# Patient Record
Sex: Female | Born: 1970 | Race: Black or African American | Hispanic: No | Marital: Single | Smoking: Never smoker
Health system: Southern US, Community
[De-identification: ages and names within clinical notes are randomized; demographics above are authoritative.]

## PROBLEM LIST (undated history)

## (undated) DIAGNOSIS — I471 Supraventricular tachycardia, unspecified: Secondary | ICD-10-CM

## (undated) DIAGNOSIS — I10 Essential (primary) hypertension: Secondary | ICD-10-CM

## (undated) DIAGNOSIS — I499 Cardiac arrhythmia, unspecified: Secondary | ICD-10-CM

## (undated) DIAGNOSIS — H269 Unspecified cataract: Secondary | ICD-10-CM

## (undated) HISTORY — DX: Supraventricular tachycardia, unspecified: I47.10

## (undated) HISTORY — DX: Unspecified cataract: H26.9

## (undated) HISTORY — DX: Essential (primary) hypertension: I10

## (undated) HISTORY — DX: Supraventricular tachycardia: I47.1

## (undated) HISTORY — DX: Cardiac arrhythmia, unspecified: I49.9

---

## 2005-09-27 ENCOUNTER — Ambulatory Visit: Payer: Self-pay | Admitting: Obstetrics and Gynecology

## 2006-02-18 ENCOUNTER — Ambulatory Visit: Payer: Self-pay | Admitting: Obstetrics and Gynecology

## 2006-02-21 ENCOUNTER — Ambulatory Visit: Payer: Self-pay | Admitting: Obstetrics and Gynecology

## 2006-02-22 ENCOUNTER — Ambulatory Visit: Payer: Self-pay | Admitting: Obstetrics and Gynecology

## 2006-04-24 ENCOUNTER — Ambulatory Visit: Payer: Self-pay | Admitting: Obstetrics and Gynecology

## 2006-05-15 ENCOUNTER — Inpatient Hospital Stay: Payer: Self-pay | Admitting: Obstetrics and Gynecology

## 2008-01-08 ENCOUNTER — Ambulatory Visit: Payer: Self-pay | Admitting: Gastroenterology

## 2008-04-16 ENCOUNTER — Ambulatory Visit: Payer: Self-pay | Admitting: Internal Medicine

## 2009-04-15 ENCOUNTER — Other Ambulatory Visit: Payer: Self-pay | Admitting: Internal Medicine

## 2009-07-20 ENCOUNTER — Other Ambulatory Visit: Payer: Self-pay | Admitting: Internal Medicine

## 2010-04-20 ENCOUNTER — Other Ambulatory Visit: Payer: Self-pay | Admitting: Internal Medicine

## 2011-06-12 ENCOUNTER — Ambulatory Visit: Payer: Self-pay | Admitting: Cardiovascular Disease

## 2011-06-12 DIAGNOSIS — R072 Precordial pain: Secondary | ICD-10-CM

## 2011-06-22 ENCOUNTER — Ambulatory Visit (INDEPENDENT_AMBULATORY_CARE_PROVIDER_SITE_OTHER): Payer: PRIVATE HEALTH INSURANCE | Admitting: Internal Medicine

## 2011-06-22 ENCOUNTER — Encounter: Payer: Self-pay | Admitting: Internal Medicine

## 2011-06-22 DIAGNOSIS — Z9289 Personal history of other medical treatment: Secondary | ICD-10-CM | POA: Insufficient documentation

## 2011-06-22 DIAGNOSIS — E538 Deficiency of other specified B group vitamins: Secondary | ICD-10-CM

## 2011-06-22 DIAGNOSIS — Z124 Encounter for screening for malignant neoplasm of cervix: Secondary | ICD-10-CM | POA: Insufficient documentation

## 2011-06-22 DIAGNOSIS — I498 Other specified cardiac arrhythmias: Secondary | ICD-10-CM

## 2011-06-22 DIAGNOSIS — D509 Iron deficiency anemia, unspecified: Secondary | ICD-10-CM

## 2011-06-22 DIAGNOSIS — I471 Supraventricular tachycardia: Secondary | ICD-10-CM

## 2011-06-22 MED ORDER — CYANOCOBALAMIN 1000 MCG/ML IJ SOLN: 1000.0000 ug | INTRAMUSCULAR | Status: DC

## 2011-06-22 NOTE — Progress Notes (Signed)
  Subjective:    Patient ID: Theresa Fox, female    DOB: February 13, 1971, 40 y.o.   MRN: [id number]  HPI   40 yo AA female with no significant past medical history returns after she underwent a stress test on Oct 23 by Dossie Arbour for baseline tachycardia.  She was noted to have a very high resting heart rate of around 120 with SVT noted during the test.  Since then she has been taking 5 mg bystolic daily with improvement in baseline tachycardia.  Her pulse has been in the 70s lately. Has not returned to exercise yet.  bp has been in the low 110s to 120's .   No past medical history on file. No current outpatient prescriptions on file prior to visit.    Review of Systems  Constitutional: Negative for fever, chills and unexpected weight change.  HENT: Negative for hearing loss, ear pain, nosebleeds, congestion, sore throat, facial swelling, rhinorrhea, sneezing, mouth sores, trouble swallowing, neck pain, neck stiffness, voice change, postnasal drip, sinus pressure, tinnitus and ear discharge.   Eyes: Negative for pain, discharge, redness and visual disturbance.  Respiratory: Negative for cough, chest tightness, shortness of breath, wheezing and stridor.   Cardiovascular: Negative for chest pain, palpitations and leg swelling.  Musculoskeletal: Negative for myalgias and arthralgias.  Skin: Negative for color change and rash.  Neurological: Negative for dizziness, weakness, light-headedness and headaches.  Hematological: Negative for adenopathy.       Objective:   Physical Exam  Constitutional: She is oriented to person, place, and time. She appears well-developed and well-nourished.  HENT:  Mouth/Throat: Oropharynx is clear and moist.  Eyes: EOM are normal. Pupils are equal, round, and reactive to light. No scleral icterus.  Neck: Normal range of motion. Neck supple. No JVD present. No thyromegaly present.  Cardiovascular: Normal rate, regular rhythm, normal heart sounds and intact distal  pulses.   Pulmonary/Chest: Effort normal and breath sounds normal.  Abdominal: Soft. Bowel sounds are normal. She exhibits no mass. There is no tenderness.  Musculoskeletal: Normal range of motion. She exhibits no edema.  Lymphadenopathy:    She has no cervical adenopathy.  Neurological: She is alert and oriented to person, place, and time.  Skin: Skin is warm and dry.  Psychiatric: She has a normal mood and affect.          Assessment & Plan:

## 2011-06-24 DIAGNOSIS — I471 Supraventricular tachycardia: Secondary | ICD-10-CM | POA: Insufficient documentation

## 2011-06-24 NOTE — Assessment & Plan Note (Signed)
Etiology unclear., review of last year's office visits show no evidence  of pulses greater than 80.   Will check thyroid.  Reviewed caffeine intake which is not excessive.

## 2011-08-06 ENCOUNTER — Other Ambulatory Visit (INDEPENDENT_AMBULATORY_CARE_PROVIDER_SITE_OTHER): Payer: PRIVATE HEALTH INSURANCE | Admitting: *Deleted

## 2011-08-06 DIAGNOSIS — D649 Anemia, unspecified: Secondary | ICD-10-CM

## 2011-08-06 LAB — FERRITIN: Ferritin: 15.2 ng/mL (ref 10.0–291.0)

## 2011-08-06 LAB — CBC WITH DIFFERENTIAL/PLATELET
Basophils Relative: 0.4 % (ref 0.0–3.0)
Eosinophils Absolute: 0.1 10*3/uL (ref 0.0–0.7)
HCT: 38.8 % (ref 36.0–46.0)
Hemoglobin: 13.7 g/dL (ref 12.0–15.0)
MCHC: 35.2 g/dL (ref 30.0–36.0)
MCV: 90.2 fl (ref 78.0–100.0)
Monocytes Absolute: 0.4 10*3/uL (ref 0.1–1.0)
Neutro Abs: 4.6 10*3/uL (ref 1.4–7.7)
RBC: 4.3 Mil/uL (ref 3.87–5.11)

## 2011-08-06 LAB — IRON AND TIBC
%SAT: 25 % (ref 20–55)
Iron: 93 ug/dL (ref 42–145)
UIBC: 284 ug/dL (ref 125–400)

## 2011-08-08 ENCOUNTER — Encounter: Payer: Self-pay | Admitting: *Deleted

## 2011-08-16 ENCOUNTER — Encounter: Payer: Self-pay | Admitting: Internal Medicine

## 2011-09-12 ENCOUNTER — Encounter: Payer: Self-pay | Admitting: Internal Medicine

## 2011-09-12 ENCOUNTER — Ambulatory Visit (INDEPENDENT_AMBULATORY_CARE_PROVIDER_SITE_OTHER): Payer: PRIVATE HEALTH INSURANCE | Admitting: Internal Medicine

## 2011-09-12 VITALS — BP 124/80 | HR 104 | Temp 98.2°F | Wt 179.0 lb

## 2011-09-12 DIAGNOSIS — H6690 Otitis media, unspecified, unspecified ear: Secondary | ICD-10-CM

## 2011-09-12 DIAGNOSIS — H6692 Otitis media, unspecified, left ear: Secondary | ICD-10-CM | POA: Insufficient documentation

## 2011-09-12 MED ORDER — NEBIVOLOL HCL 5 MG PO TABS: 5.0000 mg | ORAL_TABLET | Freq: Every day | ORAL | Status: DC

## 2011-09-12 MED ORDER — AMOXICILLIN-POT CLAVULANATE 875-125 MG PO TABS: 1.0000 | ORAL_TABLET | Freq: Two times a day (BID) | ORAL | Status: AC

## 2011-09-12 MED ORDER — FLUCONAZOLE 150 MG PO TABS: 150.0000 mg | ORAL_TABLET | Freq: Every day | ORAL | Status: AC

## 2011-09-12 MED ORDER — MOMETASONE FUROATE 50 MCG/ACT NA SUSP: 2.0000 | Freq: Every day | NASAL | Status: DC

## 2011-09-12 NOTE — Progress Notes (Signed)
Subjective:    Patient ID: Theresa Fox, female    DOB: 08/24/1970, 41 y.o.   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 She has been having frequent headaches eustachian tube dysfunction but denies fevers and flu symptoms. She has been taking over-the-counter medications consistently with no significant improvement. She is nonsmoker and she did have her flu vaccine this year . No past medical history on file. Current Outpatient Prescriptions on File Prior to Visit  Medication Sig Dispense Refill  . CYANOCOBALAMIN IJ Inject as directed every 30 (thirty) days.        . cyanocobalamin (,VITAMIN B-12,) 1000 MCG/ML injection Inject 1 mL (1,000 mcg total) into the muscle once a week.  10 mL  1     Review of Systems  Constitutional: Negative for fever, chills and unexpected weight change.  HENT: Positive for ear pain, congestion, rhinorrhea, postnasal drip and sinus pressure. Negative for hearing loss, nosebleeds, sore throat, facial swelling, sneezing, mouth sores, trouble swallowing, neck pain, neck stiffness, voice change, tinnitus and ear discharge.   Eyes: Negative for pain, discharge, redness and visual disturbance.  Respiratory: Negative for cough, chest tightness, shortness of breath, wheezing and stridor.   Cardiovascular: Negative for chest pain, palpitations and leg swelling.  Musculoskeletal: Negative for myalgias and arthralgias.  Skin: Negative for color change and rash.  Neurological: Positive for headaches. Negative for dizziness, weakness and light-headedness.  Hematological: Negative for adenopathy.       Objective:   Physical Exam  Constitutional: She is oriented to person, place, and time. She appears well-developed and well-nourished.  HENT:  Mouth/Throat: Oropharynx is clear and moist.  Eyes: EOM are normal. Pupils are equal, round, and  reactive to light. No scleral icterus.  Neck: Normal range of motion. Neck supple. No JVD present. No thyromegaly present.  Cardiovascular: Normal rate, regular rhythm, normal heart sounds and intact distal pulses.   Pulmonary/Chest: Effort normal and breath sounds normal.  Abdominal: Soft. Bowel sounds are normal. She exhibits no mass. There is no tenderness.  Musculoskeletal: Normal range of motion. She exhibits no edema.  Lymphadenopathy:    She has no cervical adenopathy.  Neurological: She is alert and oriented to person, place, and time.  Skin: Skin is warm and dry.  Psychiatric: She has a normal mood and affect.          Assessment & Plan:   Otitis media of left ear With erythematous eardrum and serous effusion noted. We'll treat empirically with Augmentin and decongestant. She does have a history of SVT so we will use Sudafed PE cautiously. She can use topical Afrin and saline nasal spray if the Sudafed PE causes tachycardia.    Updated Medication List Outpatient Encounter Prescriptions as of 09/12/2011  Medication Sig Dispense Refill  . CYANOCOBALAMIN IJ Inject as directed every 30 (thirty) days.        . nebivolol (BYSTOLIC) 5 MG tablet Take 5 mg by mouth daily.      Marland Kitchen amoxicillin-clavulanate (AUGMENTIN) 875-125 MG per tablet Take 1 tablet by mouth 2 (two) times daily.  14 tablet  0  . cyanocobalamin (,VITAMIN B-12,) 1000 MCG/ML injection Inject 1 mL (1,000 mcg total) into the muscle once a week.  10 mL  1  . fluconazole (DIFLUCAN) 150 MG tablet Take 1 tablet (150 mg total) by mouth daily.  2 tablet  0  . mometasone (  NASONEX) 50 MCG/ACT nasal spray Place 2 sprays into the nose daily.  17 g  12  . nebivolol (BYSTOLIC) 5 MG tablet Take 1 tablet (5 mg total) by mouth daily.  30 tablet  3

## 2011-09-12 NOTE — Patient Instructions (Signed)
You have an ear infection which resulted from chronic sinus congestion..  Augmentin for 7 day  Sudafed PE 10 mg every 6 hours   Afrin twivce daily for 5 days then daily i n the evening for one week then stop  Start your steroid nasal spray tomorrow.

## 2011-09-13 ENCOUNTER — Encounter: Payer: Self-pay | Admitting: Internal Medicine

## 2011-09-13 DIAGNOSIS — I471 Supraventricular tachycardia: Secondary | ICD-10-CM | POA: Insufficient documentation

## 2011-09-13 NOTE — Assessment & Plan Note (Signed)
With erythematous eardrum and serous effusion noted. We'll treat empirically with Augmentin and decongestant. She does have a history of SVT so we will use Sudafed PE cautiously. She can use topical Afrin and saline nasal spray if the Sudafed PE causes tachycardia.

## 2011-10-02 ENCOUNTER — Ambulatory Visit: Payer: Self-pay | Admitting: Obstetrics & Gynecology

## 2012-05-02 ENCOUNTER — Ambulatory Visit (INDEPENDENT_AMBULATORY_CARE_PROVIDER_SITE_OTHER): Payer: 59 | Admitting: Internal Medicine

## 2012-05-02 VITALS — BP 120/84 | HR 76 | Temp 98.6°F | Wt 177.0 lb

## 2012-05-02 DIAGNOSIS — Z23 Encounter for immunization: Secondary | ICD-10-CM

## 2012-05-02 DIAGNOSIS — J358 Other chronic diseases of tonsils and adenoids: Secondary | ICD-10-CM

## 2012-05-02 DIAGNOSIS — J029 Acute pharyngitis, unspecified: Secondary | ICD-10-CM

## 2012-05-02 MED ORDER — AMOXICILLIN 500 MG PO CAPS: 500.0000 mg | ORAL_CAPSULE | Freq: Three times a day (TID) | ORAL | Status: AC

## 2012-05-02 NOTE — Patient Instructions (Signed)
Do not start antibiotic unless you have a fever over 100.4 or develop severe pain

## 2012-05-04 ENCOUNTER — Encounter: Payer: Self-pay | Admitting: Internal Medicine

## 2012-05-04 DIAGNOSIS — J358 Other chronic diseases of tonsils and adenoids: Secondary | ICD-10-CM | POA: Insufficient documentation

## 2012-05-04 DIAGNOSIS — I10 Essential (primary) hypertension: Secondary | ICD-10-CM | POA: Insufficient documentation

## 2012-05-04 NOTE — Assessment & Plan Note (Signed)
Viral by history and  exam is normal. The white patch that she was seen on her tonsil is a tonsil stone. Continues symptomatic control viral symptoms. If her symptoms get worse she can start amoxicillin in a few days.

## 2012-05-04 NOTE — Assessment & Plan Note (Signed)
HEENT exam today is normal except for one small tonsil stone on the right. Reassurance provided this is not pharyngitis or anything infectious.

## 2012-05-04 NOTE — Progress Notes (Signed)
Patient ID: Theresa Fox, female   DOB: Sep 29, 1970, 41 y.o.   MRN: [id number]  Patient Active Problem List  Diagnosis  . Screening for cervical cancer  . History of mammogram  . SVT (supraventricular tachycardia)  . Otitis media of left ear  . Supraventricular tachycardia, nonsustained  . Pharyngitis  . Hypertension  . Tonsil stone    Subjective:  CC:   Chief Complaint  Patient presents with  . Sore Throat    HPI:   Theresa Fox a 41 y.o. female who presents with a four-day history of upper respiratory symptoms without fevers or purulent sinus drainage. She woke up yesterday noticed that she had a white spot on the back of her throat so she was worried about Strep throat in the setting of mild pharyngitis. She currently works from home so has not been exposed to any sick patients her sick family members.   Past Medical History  Diagnosis Date  . Supraventricular tachycardia, nonsustained     History reviewed. No pertinent past surgical history.       The following portions of the patient's history were reviewed and updated as appropriate: Allergies, current medications, and problem list.    Review of Systems:   12 Pt  review of systems was negative except those addressed in the HPI,     History   Social History  . Marital Status: Single    Spouse Name: N/A    Number of Children: N/A  . Years of Education: N/A   Occupational History  . Not on file.   Social History Main Topics  . Smoking status: Never Smoker   . Smokeless tobacco: Never Used  . Alcohol Use: Yes  . Drug Use: No  . Sexually Active: Not on file   Other Topics Concern  . Not on file   Social History Narrative  . No narrative on file    Objective:  BP 120/84  Pulse 76  Temp 98.6 F (37 C) (Oral)  Wt 177 lb (80.287 kg)  General appearance: alert, cooperative and appears stated age Ears: normal TM's and external ear canals both ears Throat: lips, mucosa, and tongue  normal; teeth and gums normal.  0.5 cm tonsil stone right tonsillar pillar Neck: no adenopathy, no carotid bruit, supple, symmetrical, trachea midline and thyroid not enlarged, symmetric, no tenderness/mass/nodules Back: symmetric, no curvature. ROM normal. No CVA tenderness. Lungs: clear to auscultation bilaterally Heart: regular rate and rhythm, S1, S2 normal, no murmur, click, rub or gallop Abdomen: soft, non-tender; bowel sounds normal; no masses,  no organomegaly Pulses: 2+ and symmetric Skin: Skin color, texture, turgor normal. No rashes or lesions Lymph nodes: Cervical, supraclavicular, and axillary nodes normal.  Assessment and Plan:  Tonsil stone HEENT exam today is normal except for one small tonsil stone on the right. Reassurance provided this is not pharyngitis or anything infectious.  Pharyngitis Viral by history and  exam is normal. The white patch that she was seen on her tonsil is a tonsil stone. Continues symptomatic control viral symptoms. If her symptoms get worse she can start amoxicillin in a few days.   Updated Medication List Outpatient Encounter Prescriptions as of 05/02/2012  Medication Sig Dispense Refill  . mometasone (NASONEX) 50 MCG/ACT nasal spray Place 2 sprays into the nose daily.  17 g  12  . nebivolol (BYSTOLIC) 5 MG tablet Take 1 tablet (5 mg total) by mouth daily.  30 tablet  3  . DISCONTD: nebivolol (BYSTOLIC) 5 MG tablet Take  5 mg by mouth daily.      Marland Kitchen amoxicillin (AMOXIL) 500 MG capsule Take 1 capsule (500 mg total) by mouth 3 (three) times daily.  21 capsule  0  . cyanocobalamin (,VITAMIN B-12,) 1000 MCG/ML injection Inject 1 mL (1,000 mcg total) into the muscle once a week.  10 mL  1  . CYANOCOBALAMIN IJ Inject as directed every 30 (thirty) days.           Orders Placed This Encounter  Procedures  . [facility] MAMMOGRAPHY  . Flu vaccine greater than or equal to 3yo preservative free IM    No Follow-up on file.

## 2012-08-26 ENCOUNTER — Encounter: Payer: PRIVATE HEALTH INSURANCE | Admitting: Internal Medicine

## 2012-10-24 ENCOUNTER — Encounter: Payer: Self-pay | Admitting: Internal Medicine

## 2012-10-25 LAB — HM PAP SMEAR: HM Pap smear: NORMAL

## 2013-02-17 LAB — HM MAMMOGRAPHY: HM Mammogram: NORMAL

## 2013-03-20 ENCOUNTER — Encounter: Payer: 59 | Admitting: Internal Medicine

## 2013-03-27 ENCOUNTER — Ambulatory Visit (INDEPENDENT_AMBULATORY_CARE_PROVIDER_SITE_OTHER): Payer: 59 | Admitting: Internal Medicine

## 2013-03-27 ENCOUNTER — Encounter: Payer: Self-pay | Admitting: Internal Medicine

## 2013-03-27 VITALS — BP 132/92 | HR 80 | Temp 98.1°F | Resp 14 | Ht 64.0 in | Wt 178.8 lb

## 2013-03-27 DIAGNOSIS — I10 Essential (primary) hypertension: Secondary | ICD-10-CM

## 2013-03-27 DIAGNOSIS — Z0000 Encounter for general adult medical examination without abnormal findings: Secondary | ICD-10-CM

## 2013-03-27 DIAGNOSIS — E538 Deficiency of other specified B group vitamins: Secondary | ICD-10-CM

## 2013-03-27 DIAGNOSIS — E669 Obesity, unspecified: Secondary | ICD-10-CM

## 2013-03-27 MED ORDER — MOMETASONE FUROATE 50 MCG/ACT NA SUSP: 2.0000 | Freq: Every day | NASAL | Status: DC

## 2013-03-27 MED ORDER — CYANOCOBALAMIN 1000 MCG/ML IJ SOLN: 1000.0000 ug | INTRAMUSCULAR | Status: DC

## 2013-03-27 MED ORDER — NEBIVOLOL HCL 5 MG PO TABS: 5.0000 mg | ORAL_TABLET | Freq: Every day | ORAL | Status: DC

## 2013-03-27 NOTE — Patient Instructions (Addendum)
Return for fasting labs at your leisure .  Schedule an RN visit sime time and bring your home BP cuff with you so we can compare  Resume Bystolic daily and nasonex daily  This is  One version of a  "Low GI"  Diet:  It will still lower your blood sugars and allow you to lose 4 to 8  lbs  per month if you follow it carefully.  Your goal with exercise is a minimum of 30 minutes of aerobic exercise 5 days per week (Walking does not count once it becomes easy!)    All of the foods can be found at grocery stores and in bulk at [employer].  The Atkins protein bars and shakes are available in more varieties at Target, WalMart and [employer].     7 AM Breakfast:  Choose from the following:  Low carbohydrate Protein  Shakes (I recommend the [employer]AS AdvantEdge "Carb Control" shakes  Or the low carb shakes by Atkins.    2.5 carbs   Arnold's "Sandwhich Thin"toasted  w/ peanut butter (no jelly: about 20 net carbs  "Bagel Thin" with cream cheese and salmon: about 20 carbs   a scrambled egg/bacon/cheese burrito made with Mission's "carb balance" whole wheat tortilla  (about 10 net carbs )   Avoid cereal and bananas, oatmeal and cream of wheat and grits. They are loaded with carbohydrates!   10 AM: high protein snack  Protein bar by Atkins (the snack size, under 200 cal, usually < 6 net carbs).    A stick of cheese:  Around 1 carb,  100 cal     Dannon Light n Fit [place] Yogurt  (80 cal, 8 carbs)  Other so called "protein bars" and Greek yogurts tend to be loaded with carbohydrates.  Remember, in food advertising, the word "energy" is synonymous for " carbohydrate."  Lunch:   A Sandwich using the bread choices listed, Can use any  Eggs,  lunchmeat, grilled meat or canned tuna), avocado, regular mayo/mustard  and cheese.  A Salad using blue cheese, ranch,  Goddess or vinagrette,  No croutons or "confetti" and no "candied nuts" but regular nuts OK.   No pretzels or chips.  Pickles and miniature sweet peppers are  a good low carb alternative that provide a "crunch"  The bread is the only source of carbohydrate in a sandwich and  can be decreased by trying some of these alternatives to traditional loaf bread  Joseph's makes a pita bread and a flat bread that are 50 cal and 4 net carbs available at BJs and WalMart.  This can be toasted to use with hummous as well  Toufayan makes a low carb flatbread that's 100 cal and 9 net carbs available at [employer] and [employer] makes 2 sizes of  Low carb whole wheat tortilla  (The large one is 210 cal and 6 net carbs) Avoid "Low fat dressings, as well as Reyne Dumas and [address] dressings They are loaded with sugar!   3 PM/ Mid day  Snack:  Consider  1 ounce of  almonds, walnuts, pistachios, pecans, peanuts,  Macadamia nuts or a nut medley.  Avoid "granola"; the dried cranberries and raisins are loaded with carbohydrates. Mixed nuts as long as there are no raisins,  cranberries or dried fruit.     6 PM  Dinner:     Meat/fowl/fish with a green salad, and either broccoli, cauliflower, green beans, spinach, brussel sprouts or  Lima beans.  DO NOT BREAD THE PROTEIN!!      There is a low carb pasta by Dreamfield's that is acceptable and tastes great: only 5 digestible carbs/serving.( All grocery stores but BJs carry it )  Try Kai Levins Angelo's chicken piccata or chicken or eggplant parm over low carb pasta.(Lowes and BJs)   Clifton Custard Sanchez's "Carnitas" (pulled pork, no sauce,  0 carbs) or his beef pot roast to make a dinner burrito (at BJ's)  Pesto over low carb pasta (bj's sells a good quality pesto in the center refrigerated section of the deli   Whole wheat pasta is still full of digestible carbs and  Not as low in glycemic index as Dreamfield's.   Brown rice is still rice,  So skip the rice and noodles if you eat [place] or [place] (or at least limit to 1/2 cup)  9 PM snack :   Breyer's "low carb" fudgsicle or  ice cream bar (Carb Smart line), or  Weight Watcher's  ice cream bar , or another "no sugar added" ice cream;  a serving of fresh berries/cherries with whipped cream   Cheese or DANNON'S LlGHT N FIT [place] YOGURT  Avoid bananas, pineapple, grapes  and watermelon on a regular basis because they are high in sugar.  THINK OF THEM AS DESSERT  Remember that snack Substitutions should be less than 10 NET carbs per serving and meals < 20 carbs. Remember to subtract fiber grams to get the "net carbs."

## 2013-03-29 ENCOUNTER — Encounter: Payer: Self-pay | Admitting: Internal Medicine

## 2013-03-29 DIAGNOSIS — Z0001 Encounter for general adult medical examination with abnormal findings: Secondary | ICD-10-CM | POA: Insufficient documentation

## 2013-03-29 DIAGNOSIS — E669 Obesity, unspecified: Secondary | ICD-10-CM | POA: Insufficient documentation

## 2013-03-29 NOTE — Assessment & Plan Note (Signed)
I have addressed  BMI and recommended a low glycemic index diet utilizing smaller more frequent meals to increase metabolism.  I have also recommended that patient start exercising with a goal of 30 minutes of aerobic exercise a minimum of 5 days per week. Screening for lipid disorders, thyroid and diabetes to be done today.   

## 2013-03-29 NOTE — Assessment & Plan Note (Signed)
Diastolic elevation discussed.  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 I have asked her to return with her home cuff so we may check its position and determine whether she needs medications to be adjusted.

## 2013-03-29 NOTE — Assessment & Plan Note (Addendum)
Annual comprehensive exam was done excluding breast, pelvic and PAP smear. All screenings have been addressed .  Mammogram has been ordered. Breast and pelvic exam were done this year by Dr. Logan Bores and Pap smear was reportedly normal.

## 2013-03-29 NOTE — Progress Notes (Signed)
Patient ID: Theresa Fox, female   DOB: 08-14-1971, 42 y.o.   MRN: [id number]  Subjective:     Theresa Fox is a 42 y.o. female and is here for a comprehensive physical exam. The patient reports weight gain. 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 She is exercising regularly but admits that she has been eating too much junk food.  History   Social History  . Marital Status: Single    Spouse Name: N/A    Number of Children: N/A  . Years of Education: N/A   Occupational History  . Not on file.   Social History Main Topics  . Smoking status: Never Smoker   . Smokeless tobacco: Never Used  . Alcohol Use: Yes  . Drug Use: No  . Sexually Active: Not on file   Other Topics Concern  . Not on file   Social History Narrative  . No narrative on file   Health Maintenance  Topic Date Due  . Tetanus/tdap  03/17/1988  . Influenza Vaccine  04/20/2013  . Pap Smear  10/26/2015    The following portions of the patient's history were reviewed and updated as appropriate: allergies, current medications, past family history, past medical history, past social history, past surgical history and problem list.  Review of Systems A comprehensive review of systems was negative.   Objective:   BP 132/92  Pulse 80  Temp(Src) 98.1 F (36.7 C) (Oral)  Resp 14  Ht 5\' 4"  (1.626 m)  Wt 178 lb 12 oz (81.08 kg)  BMI 30.67 kg/m2  SpO2 98%  LMP 02/20/2013  General appearance: alert, cooperative and appears stated age muscular build Ears: normal TM's and external ear canals both ears Throat: lips, mucosa, and tongue normal; teeth and gums normal Neck: no adenopathy, no carotid bruit, supple, symmetrical, trachea midline and thyroid not enlarged, symmetric, no tenderness/mass/nodules Back: symmetric, no curvature. ROM normal. No CVA tenderness. Lungs: clear to auscultation bilaterally Heart: regular rate  and rhythm, S1, S2 normal, no murmur, click, rub or gallop Abdomen: soft, non-tender; bowel sounds normal; no masses,  no organomegaly Pulses: 2+ and symmetric Skin: Skin color, texture, turgor normal. No rashes or lesions Lymph nodes: Cervical, supraclavicular, and axillary nodes normal.  Assessment:   Hypertension Diastolic elevation discussed.  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 I have asked her to return with her home cuff so we may check its position and determine whether she needs medications to be adjusted.  Routine general medical examination at a health care facility Annual comprehensive exam was done excluding breast, pelvic and PAP smear. All screenings have been addressed .  Mammogram has been ordered. Breast and pelvic exam were done this year by Dr. Logan Bores and Pap smear was reportedly normal.  Obesity, unspecified I have addressed  BMI and recommended a low glycemic index diet utilizing smaller more frequent meals to increase metabolism.  I have also recommended that patient start exercising with a goal of 30 minutes of aerobic exercise a minimum of 5 days per week. Screening for lipid disorders, thyroid and diabetes to be done today.     Updated Medication List Outpatient Encounter Prescriptions as of 03/27/2013  Medication Sig Dispense Refill  . cyanocobalamin (,VITAMIN B-12,) 1000 MCG/ML injection Inject 1 mL (1,000 mcg total) into the muscle once a week.  10 mL  1  . CYANOCOBALAMIN IJ Inject as directed every 30 (thirty) days.        . mometasone (NASONEX) 50 MCG/ACT nasal spray Place 2 sprays into the nose daily.  17 g  12  . nebivolol (BYSTOLIC) 5 MG tablet Take 1 tablet (5 mg total) by mouth daily.  90 tablet  3  . [DISCONTINUED] cyanocobalamin (,VITAMIN B-12,) 1000 MCG/ML injection Inject 1 mL (1,000 mcg total) into the muscle once a week.  10 mL  1  . [DISCONTINUED] mometasone (NASONEX) 50  MCG/ACT nasal spray Place 2 sprays into the nose daily.  17 g  12  . [DISCONTINUED] nebivolol (BYSTOLIC) 5 MG tablet Take 1 tablet (5 mg total) by mouth daily.  30 tablet  3   No facility-administered encounter medications on file as of 03/27/2013.

## 2013-03-30 ENCOUNTER — Telehealth: Payer: Self-pay | Admitting: *Deleted

## 2013-03-30 DIAGNOSIS — Z79899 Other long term (current) drug therapy: Secondary | ICD-10-CM

## 2013-03-30 DIAGNOSIS — Z13220 Encounter for screening for lipoid disorders: Secondary | ICD-10-CM

## 2013-03-30 DIAGNOSIS — R5381 Other malaise: Secondary | ICD-10-CM

## 2013-03-30 DIAGNOSIS — R5383 Other fatigue: Secondary | ICD-10-CM

## 2013-03-30 NOTE — Telephone Encounter (Signed)
Pt is coming in for labs tomorrow what labs and dx?  

## 2013-03-31 ENCOUNTER — Ambulatory Visit: Payer: 59 | Admitting: *Deleted

## 2013-03-31 ENCOUNTER — Other Ambulatory Visit (INDEPENDENT_AMBULATORY_CARE_PROVIDER_SITE_OTHER): Payer: 59

## 2013-03-31 VITALS — BP 118/76 | HR 79

## 2013-03-31 DIAGNOSIS — I10 Essential (primary) hypertension: Secondary | ICD-10-CM

## 2013-03-31 DIAGNOSIS — R5383 Other fatigue: Secondary | ICD-10-CM

## 2013-03-31 DIAGNOSIS — Z13220 Encounter for screening for lipoid disorders: Secondary | ICD-10-CM

## 2013-03-31 DIAGNOSIS — R5381 Other malaise: Secondary | ICD-10-CM

## 2013-03-31 LAB — COMPREHENSIVE METABOLIC PANEL
Albumin: 4 g/dL (ref 3.5–5.2)
Alkaline Phosphatase: 43 U/L (ref 39–117)
BUN: 14 mg/dL (ref 6–23)
GFR: 67.95 mL/min (ref >60.00)
Glucose, Bld: 80 mg/dL (ref 70–99)
Total Bilirubin: 1.1 mg/dL (ref 0.3–1.2)

## 2013-03-31 LAB — CBC WITH DIFFERENTIAL/PLATELET
Basophils Relative: 0.5 % (ref 0.0–3.0)
Eosinophils Absolute: 0.1 10*3/uL (ref 0.0–0.7)
HCT: 42.7 % (ref 36.0–46.0)
Lymphs Abs: 2.2 10*3/uL (ref 0.7–4.0)
MCHC: 33.3 g/dL (ref 30.0–36.0)
MCV: 90.1 fl (ref 78.0–100.0)
Monocytes Absolute: 0.3 10*3/uL (ref 0.1–1.0)
Neutrophils Relative %: 53.5 % (ref 43.0–77.0)
RBC: 4.74 Mil/uL (ref 3.87–5.11)

## 2013-03-31 LAB — LIPID PANEL
Cholesterol: 140 mg/dL (ref 0–200)
HDL: 48.8 mg/dL (ref >39.00)
VLDL: 12.2 mg/dL (ref 0.0–40.0)

## 2013-03-31 LAB — TSH: TSH: 1.49 u[IU]/mL (ref 0.35–5.50)

## 2013-04-01 ENCOUNTER — Encounter: Payer: Self-pay | Admitting: *Deleted

## 2013-04-02 ENCOUNTER — Telehealth: Payer: Self-pay | Admitting: *Deleted

## 2013-04-02 NOTE — Telephone Encounter (Signed)
Patient stated you mentioned a shot that would help stop the craving for carb's please advise.

## 2013-04-02 NOTE — Telephone Encounter (Signed)
b12 injections are used in several wt loss programs for energy,  Not necessarily cravings.  Citalopram (generic celexa) has been reported to help with carb cravings

## 2013-04-08 NOTE — Telephone Encounter (Signed)
Left detailed message for patient to return my call.  °

## 2013-06-17 ENCOUNTER — Ambulatory Visit (INDEPENDENT_AMBULATORY_CARE_PROVIDER_SITE_OTHER): Payer: 59 | Admitting: *Deleted

## 2013-06-17 ENCOUNTER — Encounter (INDEPENDENT_AMBULATORY_CARE_PROVIDER_SITE_OTHER): Payer: Self-pay

## 2013-06-17 DIAGNOSIS — Z23 Encounter for immunization: Secondary | ICD-10-CM

## 2014-06-23 ENCOUNTER — Encounter: Payer: Self-pay | Admitting: Internal Medicine

## 2014-06-23 ENCOUNTER — Ambulatory Visit (INDEPENDENT_AMBULATORY_CARE_PROVIDER_SITE_OTHER): Payer: 59 | Admitting: Internal Medicine

## 2014-06-23 ENCOUNTER — Ambulatory Visit (INDEPENDENT_AMBULATORY_CARE_PROVIDER_SITE_OTHER): Payer: 59 | Admitting: *Deleted

## 2014-06-23 VITALS — BP 124/86 | HR 80 | Temp 98.5°F | Resp 16 | Ht 64.0 in | Wt 167.5 lb

## 2014-06-23 DIAGNOSIS — R5383 Other fatigue: Secondary | ICD-10-CM

## 2014-06-23 DIAGNOSIS — Z124 Encounter for screening for malignant neoplasm of cervix: Secondary | ICD-10-CM

## 2014-06-23 DIAGNOSIS — D519 Vitamin B12 deficiency anemia, unspecified: Secondary | ICD-10-CM

## 2014-06-23 DIAGNOSIS — Z1159 Encounter for screening for other viral diseases: Secondary | ICD-10-CM

## 2014-06-23 DIAGNOSIS — F409 Phobic anxiety disorder, unspecified: Secondary | ICD-10-CM

## 2014-06-23 DIAGNOSIS — N979 Female infertility, unspecified: Secondary | ICD-10-CM | POA: Insufficient documentation

## 2014-06-23 DIAGNOSIS — E663 Overweight: Secondary | ICD-10-CM

## 2014-06-23 DIAGNOSIS — Z0000 Encounter for general adult medical examination without abnormal findings: Secondary | ICD-10-CM

## 2014-06-23 DIAGNOSIS — Z13220 Encounter for screening for lipoid disorders: Secondary | ICD-10-CM

## 2014-06-23 DIAGNOSIS — F5105 Insomnia due to other mental disorder: Secondary | ICD-10-CM

## 2014-06-23 DIAGNOSIS — I10 Essential (primary) hypertension: Secondary | ICD-10-CM

## 2014-06-23 DIAGNOSIS — Z111 Encounter for screening for respiratory tuberculosis: Secondary | ICD-10-CM

## 2014-06-23 DIAGNOSIS — Z23 Encounter for immunization: Secondary | ICD-10-CM

## 2014-06-23 MED ORDER — ALPRAZOLAM 0.25 MG PO TABS: 0.2500 mg | ORAL_TABLET | Freq: Two times a day (BID) | ORAL | Status: DC | PRN

## 2014-06-23 NOTE — Progress Notes (Signed)
Patient ID: Theresa Fox, female   DOB: 11-28-1970, 43 y.o.   MRN: [id number]    Subjective:     Theresa Fox is a 43 y.o. female and is here for a comprehensive physical exam. The patient reports occasional insomnia.  History   Social History  . Marital Status: Single    Spouse Name: N/A    Number of Children: N/A  . Years of Education: N/A   Occupational History  . 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 Smoking status: Never Smoker   . Smokeless tobacco: Never Used  . Alcohol Use: 3.5 oz/week    7 drink(s) per week  . Drug Use: No  . Sexual Activity:    Partners: Male   Other Topics Concern  . Not on file   Social History Narrative   Health Maintenance  Topic Date Due  . Samul Dada  03/17/1988  . INFLUENZA VACCINE  03/21/2015  . PAP SMEAR  10/26/2015    The following portions of the patient's history were reviewed and updated as appropriate: allergies, current medications, past family history, past medical history, past social history, past surgical history and problem list.  Review of Systems A comprehensive review of systems was negative.   Objective:   BP 124/86 mmHg  Pulse 80  Temp(Src) 98.5 F (36.9 C) (Oral)  Resp 16  Ht 5\' 4"  (1.626 m)  Wt 167 lb 8 oz (75.978 kg)  BMI 28.74 kg/m2  SpO2 98%  LMP 06/18/2014   General appearance: alert, cooperative and appears stated age Head: Normocephalic, without obvious abnormality, atraumatic Eyes: conjunctivae/corneas clear. PERRL, EOM's intact. Fundi benign. Ears: normal TM's and external ear canals both ears Nose: Nares normal. Septum midline. Mucosa normal. No drainage or sinus tenderness. Throat: lips, mucosa, and tongue normal; teeth and gums normal Neck: no adenopathy, no carotid bruit, no JVD, supple, symmetrical, trachea midline and thyroid not enlarged, symmetric, no tenderness/mass/nodules Lungs: clear to auscultation bilaterally Breasts: deferred,  Done by  GYN Heart: regular rate and rhythm, S1, S2 normal, no murmur, click, rub or gallop Abdomen: soft, non-tender; bowel sounds normal; no masses,  no organomegaly Extremities: extremities normal, atraumatic, no cyanosis or edema Pulses: 2+ and symmetric Skin: Skin color, texture, turgor normal. No rashes or lesions Neurologic: Alert and oriented X 3, normal strength and tone. Normal symmetric reflexes. Normal coordination and gait.    Assessment and Plan:    Encounter for preventive health examination Annual wellness  exam was done as well as a comprehensive physical exam and management of acute and chronic conditions .  During the course of the visit the patient was educated and counseled about appropriate screening and preventive services including : fall prevention , diabetes screening, nutrition counseling, colorectal cancer screening, and recommended immunizations.  Printed recommendations for health maintenance screenings was given.   Lab Results  Component Value Date   TSH 1.94 06/25/2014   Lab Results  Component Value Date   CHOL 159 06/25/2014   HDL 59.30 06/25/2014   LDLCALC 90 06/25/2014   TRIG 47.0 06/25/2014   CHOLHDL 3 06/25/2014   Lab Results  Component Value Date   CREATININE 1.0 06/25/2014   Lab Results  Component Value Date   NA 138 06/25/2014   K 3.8 06/25/2014   CL 106 06/25/2014   CO2 23 06/25/2014     Hypertension Well controlled on current regimen. Renal function stable, no changes today..   Lab Results  Component Value Date   CREATININE 1.0 06/25/2014   Lab Results  Component Value Date   NA 138 06/25/2014   K 3.8 06/25/2014   CL 106 06/25/2014   CO2 23 06/25/2014     Overweight (BMI 25.0-29.9) I have congratulated her in reduction of   BMI and encouraged  Continued weight loss with goal of 10% of body weight over the next 6 months using a low glycemic index diet and regular exercise a minimum of 5 days per week.    Insomnia due to  anxiety and fear Managed with low dose alprazolam.  Refills given.    Updated Medication List Outpatient Encounter Prescriptions as of 06/23/2014  Medication Sig  . cyanocobalamin (,VITAMIN B-12,) 1000 MCG/ML injection Inject 1 mL (1,000 mcg total) into the muscle once a week.  . CYANOCOBALAMIN IJ Inject as directed every 30 (thirty) days.    . nebivolol (BYSTOLIC) 5 MG tablet Take 1 tablet (5 mg total) by mouth daily.  Marland Kitchen ALPRAZolam (XANAX) 0.25 MG tablet Take 1 tablet (0.25 mg total) by mouth 2 (two) times daily as needed for anxiety.  . mometasone (NASONEX) 50 MCG/ACT nasal spray Place 2 sprays into the nose daily.

## 2014-06-23 NOTE — Patient Instructions (Addendum)
Return on Friday for fasting labs and reading of your PPD   Health Maintenance Adopting a healthy lifestyle and getting preventive care can go a long way to promote health and wellness. Talk with your health care provider about what schedule of regular examinations is right for you. This is a good chance for you to check in with your provider about disease prevention and staying healthy. In between checkups, there are plenty of things you can do on your own. Experts have done a lot of research about which lifestyle changes and preventive measures are most likely to keep you healthy. Ask your health care provider for more information. WEIGHT AND DIET  Eat a healthy diet  Be sure to include plenty of vegetables, fruits, low-fat dairy products, and lean protein.  Do not eat a lot of foods high in solid fats, added sugars, or salt.  Get regular exercise. This is one of the most important things you can do for your health.  Most adults should exercise for at least 150 minutes each week. The exercise should increase your heart rate and make you sweat (moderate-intensity exercise).  Most adults should also do strengthening exercises at least twice a week. This is in addition to the moderate-intensity exercise.  Maintain a healthy weight  Body mass index (BMI) is a measurement that can be used to identify possible weight problems. It estimates body fat based on height and weight. Your health care provider can help determine your BMI and help you achieve or maintain a healthy weight.  For females 51 years of age and older:   A BMI below 18.5 is considered underweight.  A BMI of 18.5 to 24.9 is normal.  A BMI of 25 to 29.9 is considered overweight.  A BMI of 30 and above is considered obese.  Watch levels of cholesterol and blood lipids  You should start having your blood tested for lipids and cholesterol at 45 years of age, then have this test every 5 years.  You may need to have your  cholesterol levels checked more often if:  Your lipid or cholesterol levels are high.  You are older than 45 years of age.  You are at high risk for heart disease.  CANCER SCREENING   Lung Cancer  Lung cancer screening is recommended for adults 44-30 years old who are at high risk for lung cancer because of a history of smoking.  A yearly low-dose CT scan of the lungs is recommended for people who:  Currently smoke.  Have quit within the past 15 years.  Have at least a 30-pack-year history of smoking. A pack year is smoking an average of one pack of cigarettes a day for 1 year.  Yearly screening should continue until it has been 15 years since you quit.  Yearly screening should stop if you develop a health problem that would prevent you from having lung cancer treatment.  Breast Cancer  Practice breast self-awareness. This means understanding how your breasts normally appear and feel.  It also means doing regular breast self-exams. Let your health care provider know about any changes, no matter how small.  If you are in your 20s or 30s, you should have a clinical breast exam (CBE) by a health care provider every 1-3 years as part of a regular health exam.  If you are 78 or older, have a CBE every year. Also consider having a breast X-ray (mammogram) every year.  If you have a family history of breast cancer,  talk to your health care provider about genetic screening.  If you are at high risk for breast cancer, talk to your health care provider about having an MRI and a mammogram every year.  Breast cancer gene (BRCA) assessment is recommended for women who have family members with BRCA-related cancers. BRCA-related cancers include:  Breast.  Ovarian.  Tubal.  Peritoneal cancers.  Results of the assessment will determine the need for genetic counseling and BRCA1 and BRCA2 testing. Cervical Cancer Routine pelvic examinations to screen for cervical cancer are no  longer recommended for nonpregnant women who are considered low risk for cancer of the pelvic organs (ovaries, uterus, and vagina) and who do not have symptoms. A pelvic examination may be necessary if you have symptoms including those associated with pelvic infections. Ask your health care provider if a screening pelvic exam is right for you.   The Pap test is the screening test for cervical cancer for women who are considered at risk.  If you had a hysterectomy for a problem that was not cancer or a condition that could lead to cancer, then you no longer need Pap tests.  If you are older than 65 years, and you have had normal Pap tests for the past 10 years, you no longer need to have Pap tests.  If you have had past treatment for cervical cancer or a condition that could lead to cancer, you need Pap tests and screening for cancer for at least 20 years after your treatment.  If you no longer get a Pap test, assess your risk factors if they change (such as having a new sexual partner). This can affect whether you should start being screened again.  Some women have medical problems that increase their chance of getting cervical cancer. If this is the case for you, your health care provider may recommend more frequent screening and Pap tests.  The human papillomavirus (HPV) test is another test that may be used for cervical cancer screening. The HPV test looks for the virus that can cause cell changes in the cervix. The cells collected during the Pap test can be tested for HPV.  The HPV test can be used to screen women 12 years of age and older. Getting tested for HPV can extend the interval between normal Pap tests from three to five years.  An HPV test also should be used to screen women of any age who have unclear Pap test results.  After 45 years of age, women should have HPV testing as often as Pap tests.  Colorectal Cancer  This type of cancer can be detected and often  prevented.  Routine colorectal cancer screening usually begins at 45 years of age and continues through 45 years of age.  Your health care provider may recommend screening at an earlier age if you have risk factors for colon cancer.  Your health care provider may also recommend using home test kits to check for hidden blood in the stool.  A small camera at the end of a tube can be used to examine your colon directly (sigmoidoscopy or colonoscopy). This is done to check for the earliest forms of colorectal cancer.  Routine screening usually begins at age 35.  Direct examination of the colon should be repeated every 5-10 years through 45 years of age. However, you may need to be screened more often if early forms of precancerous polyps or small growths are found. Skin Cancer  Check your skin from head to toe regularly.  Tell your health care provider about any new moles or changes in moles, especially if there is a change in a mole's shape or color.  Also tell your health care provider if you have a mole that is larger than the size of a pencil eraser.  Always use sunscreen. Apply sunscreen liberally and repeatedly throughout the day.  Protect yourself by wearing long sleeves, pants, a wide-brimmed hat, and sunglasses whenever you are outside. HEART DISEASE, DIABETES, AND HIGH BLOOD PRESSURE   Have your blood pressure checked at least every 1-2 years. High blood pressure causes heart disease and increases the risk of stroke.  If you are between 72 years and 44 years old, ask your health care provider if you should take aspirin to prevent strokes.  Have regular diabetes screenings. This involves taking a blood sample to check your fasting blood sugar level.  If you are at a normal weight and have a low risk for diabetes, have this test once every three years after 45 years of age.  If you are overweight and have a high risk for diabetes, consider being tested at a younger age or more  often. PREVENTING INFECTION  Hepatitis B  If you have a higher risk for hepatitis B, you should be screened for this virus. You are considered at high risk for hepatitis B if:  You were born in a country where hepatitis B is common. Ask your health care provider which countries are considered high risk.  Your parents were born in a high-risk country, and you have not been immunized against hepatitis B (hepatitis B vaccine).  You have HIV or AIDS.  You use needles to inject street drugs.  You live with someone who has hepatitis B.  You have had sex with someone who has hepatitis B.  You get hemodialysis treatment.  You take certain medicines for conditions, including cancer, organ transplantation, and autoimmune conditions. Hepatitis C  Blood testing is recommended for:  Everyone born from [birth year] through 1967.  Anyone with known risk factors for hepatitis C. Sexually transmitted infections (STIs)  You should be screened for sexually transmitted infections (STIs) including gonorrhea and chlamydia if:  You are sexually active and are younger than 45 years of age.  You are older than 45 years of age and your health care provider tells you that you are at risk for this type of infection.  Your sexual activity has changed since you were last screened and you are at an increased risk for chlamydia or gonorrhea. Ask your health care provider if you are at risk.  If you do not have HIV, but are at risk, it may be recommended that you take a prescription medicine daily to prevent HIV infection. This is called pre-exposure prophylaxis (PrEP). You are considered at risk if:  You are sexually active and do not regularly use condoms or know the HIV status of your partner(s).  You take drugs by injection.  You are sexually active with a partner who has HIV. Talk with your health care provider about whether you are at high risk of being infected with HIV. If you choose to begin PrEP, you  should first be tested for HIV. You should then be tested every 3 months for as long as you are taking PrEP.  PREGNANCY   If you are premenopausal and you may become pregnant, ask your health care provider about preconception counseling.  If you may become pregnant, take 400 to 800 micrograms (mcg) of folic acid  every day.  If you want to prevent pregnancy, talk to your health care provider about birth control (contraception). OSTEOPOROSIS AND MENOPAUSE   Osteoporosis is a disease in which the bones lose minerals and strength with aging. This can result in serious bone fractures. Your risk for osteoporosis can be identified using a bone density scan.  If you are 63 years of age or older, or if you are at risk for osteoporosis and fractures, ask your health care provider if you should be screened.  Ask your health care provider whether you should take a calcium or vitamin D supplement to lower your risk for osteoporosis.  Menopause may have certain physical symptoms and risks.  Hormone replacement therapy may reduce some of these symptoms and risks. Talk to your health care provider about whether hormone replacement therapy is right for you.  HOME CARE INSTRUCTIONS   Schedule regular health, dental, and eye exams.  Stay current with your immunizations.   Do not use any tobacco products including cigarettes, chewing tobacco, or electronic cigarettes.  If you are pregnant, do not drink alcohol.  If you are breastfeeding, limit how much and how often you drink alcohol.  Limit alcohol intake to no more than 1 drink per day for nonpregnant women. One drink equals 12 ounces of beer, 5 ounces of wine, or 1 ounces of hard liquor.  Do not use street drugs.  Do not share needles.  Ask your health care provider for help if you need support or information about quitting drugs.  Tell your health care provider if you often feel depressed.  Tell your health care provider if you have ever  been abused or do not feel safe at home. Document Released: 02/19/2011 Document Revised: 12/21/2013 Document Reviewed: 07/08/2013 [facility] Patient Information 2015 [place], [place]. This information is not intended to replace advice given to you by your health care provider. Make sure you discuss any questions you have with your health care provider.

## 2014-06-23 NOTE — Progress Notes (Signed)
Pre-visit discussion using our clinic review tool. No additional management support is needed unless otherwise documented below in the visit note.  

## 2014-06-24 ENCOUNTER — Telehealth: Payer: Self-pay | Admitting: Internal Medicine

## 2014-06-24 NOTE — Telephone Encounter (Signed)
emmi emailed °

## 2014-06-25 ENCOUNTER — Other Ambulatory Visit (INDEPENDENT_AMBULATORY_CARE_PROVIDER_SITE_OTHER): Payer: 59

## 2014-06-25 DIAGNOSIS — I10 Essential (primary) hypertension: Secondary | ICD-10-CM

## 2014-06-25 DIAGNOSIS — Z13220 Encounter for screening for lipoid disorders: Secondary | ICD-10-CM

## 2014-06-25 DIAGNOSIS — R5383 Other fatigue: Secondary | ICD-10-CM

## 2014-06-25 DIAGNOSIS — Z1159 Encounter for screening for other viral diseases: Secondary | ICD-10-CM

## 2014-06-25 DIAGNOSIS — D519 Vitamin B12 deficiency anemia, unspecified: Secondary | ICD-10-CM

## 2014-06-25 LAB — LIPID PANEL
CHOLESTEROL: 159 mg/dL (ref 0–200)
HDL: 59.3 mg/dL (ref >39.00)
LDL Cholesterol: 90 mg/dL (ref 0–99)
NonHDL: 99.7
Total CHOL/HDL Ratio: 3
Triglycerides: 47 mg/dL (ref 0.0–149.0)
VLDL: 9.4 mg/dL (ref 0.0–40.0)

## 2014-06-25 LAB — CBC WITH DIFFERENTIAL/PLATELET
Basophils Absolute: 0 10*3/uL (ref 0.0–0.1)
Basophils Relative: 0.5 % (ref 0.0–3.0)
EOS PCT: 1.9 % (ref 0.0–5.0)
Eosinophils Absolute: 0.1 10*3/uL (ref 0.0–0.7)
HCT: 36.9 % (ref 36.0–46.0)
HEMOGLOBIN: 12.5 g/dL (ref 12.0–15.0)
LYMPHS ABS: 1.7 10*3/uL (ref 0.7–4.0)
LYMPHS PCT: 29.8 % (ref 12.0–46.0)
MCHC: 33.9 g/dL (ref 30.0–36.0)
MCV: 86.4 fl (ref 78.0–100.0)
MONOS PCT: 7.2 % (ref 3.0–12.0)
Monocytes Absolute: 0.4 10*3/uL (ref 0.1–1.0)
NEUTROS ABS: 3.5 10*3/uL (ref 1.4–7.7)
Neutrophils Relative %: 60.6 % (ref 43.0–77.0)
Platelets: 308 10*3/uL (ref 150.0–400.0)
RBC: 4.27 Mil/uL (ref 3.87–5.11)
RDW: 13.8 % (ref 11.5–15.5)
WBC: 5.8 10*3/uL (ref 4.0–10.5)

## 2014-06-25 LAB — TSH: TSH: 1.94 u[IU]/mL (ref 0.35–4.50)

## 2014-06-25 LAB — COMPREHENSIVE METABOLIC PANEL
ALT: 12 U/L (ref 0–35)
AST: 18 U/L (ref 0–37)
Albumin: 3.1 g/dL — ABNORMAL LOW (ref 3.5–5.2)
Alkaline Phosphatase: 54 U/L (ref 39–117)
BUN: 10 mg/dL (ref 6–23)
CALCIUM: 8.9 mg/dL (ref 8.4–10.5)
CHLORIDE: 106 meq/L (ref 96–112)
CO2: 23 meq/L (ref 19–32)
CREATININE: 1 mg/dL (ref 0.4–1.2)
GFR: 73.61 mL/min (ref >60.00)
GLUCOSE: 91 mg/dL (ref 70–99)
Potassium: 3.8 mEq/L (ref 3.5–5.1)
Sodium: 138 mEq/L (ref 135–145)
Total Bilirubin: 0.4 mg/dL (ref 0.2–1.2)
Total Protein: 6.8 g/dL (ref 6.0–8.3)

## 2014-06-25 LAB — MICROALBUMIN / CREATININE URINE RATIO
CREATININE, U: 245.2 mg/dL
MICROALB UR: 0.9 mg/dL (ref 0.0–1.9)
Microalb Creat Ratio: 0.4 mg/g (ref 0.0–30.0)

## 2014-06-25 LAB — TB SKIN TEST
Induration: 0 mm
TB Skin Test: NEGATIVE

## 2014-06-25 LAB — VITAMIN B12: VITAMIN B 12: 583 pg/mL (ref 211–911)

## 2014-06-26 DIAGNOSIS — F409 Phobic anxiety disorder, unspecified: Secondary | ICD-10-CM | POA: Insufficient documentation

## 2014-06-26 DIAGNOSIS — F5105 Insomnia due to other mental disorder: Secondary | ICD-10-CM

## 2014-06-26 LAB — HEPATITIS C ANTIBODY: HCV AB: NEGATIVE

## 2014-06-26 NOTE — Assessment & Plan Note (Addendum)
Annual wellness  exam was done as well as a comprehensive physical exam and management of acute and chronic conditions .  During the course of the visit the patient was educated and counseled about appropriate screening and preventive services including : fall prevention , diabetes screening, nutrition counseling, colorectal cancer screening, and recommended immunizations.  Printed recommendations for health maintenance screenings was given.   Lab Results  Component Value Date   TSH 1.94 06/25/2014   Lab Results  Component Value Date   CHOL 159 06/25/2014   HDL 59.30 06/25/2014   LDLCALC 90 06/25/2014   TRIG 47.0 06/25/2014   CHOLHDL 3 06/25/2014   Lab Results  Component Value Date   CREATININE 1.0 06/25/2014   Lab Results  Component Value Date   NA 138 06/25/2014   K 3.8 06/25/2014   CL 106 06/25/2014   CO2 23 06/25/2014

## 2014-06-26 NOTE — Assessment & Plan Note (Signed)
Managed with low dose alprazolam.  Refills given.

## 2014-06-26 NOTE — Assessment & Plan Note (Signed)
I have congratulated her in reduction of   BMI and encouraged  Continued weight loss with goal of 10% of body weight over the next 6 months using a low glycemic index diet and regular exercise a minimum of 5 days per week.     

## 2014-06-26 NOTE — Assessment & Plan Note (Addendum)
Well controlled on current regimen. Renal function stable, no changes today..   Lab Results  Component Value Date   CREATININE 1.0 06/25/2014   Lab Results  Component Value Date   NA 138 06/25/2014   K 3.8 06/25/2014   CL 106 06/25/2014   CO2 23 06/25/2014

## 2014-06-28 ENCOUNTER — Encounter: Payer: Self-pay | Admitting: *Deleted

## 2014-06-29 ENCOUNTER — Ambulatory Visit: Payer: Self-pay | Admitting: Obstetrics & Gynecology

## 2016-09-25 ENCOUNTER — Telehealth: Payer: Self-pay | Admitting: Internal Medicine

## 2016-09-25 NOTE — Telephone Encounter (Signed)
Yes of course

## 2016-09-25 NOTE — Telephone Encounter (Signed)
Pt called and was looking to make an appt with Dr. Derrel Nip. Last visit was on 06/23/14. Please advise, thank you!  Call pt @ [phone]

## 2016-09-27 ENCOUNTER — Ambulatory Visit (INDEPENDENT_AMBULATORY_CARE_PROVIDER_SITE_OTHER): Payer: 59

## 2016-09-27 ENCOUNTER — Ambulatory Visit (INDEPENDENT_AMBULATORY_CARE_PROVIDER_SITE_OTHER): Payer: 59 | Admitting: Internal Medicine

## 2016-09-27 ENCOUNTER — Encounter: Payer: Self-pay | Admitting: Internal Medicine

## 2016-09-27 ENCOUNTER — Encounter (INDEPENDENT_AMBULATORY_CARE_PROVIDER_SITE_OTHER): Payer: Self-pay

## 2016-09-27 VITALS — BP 120/82 | HR 93 | Resp 16 | Ht 62.0 in | Wt 198.0 lb

## 2016-09-27 DIAGNOSIS — R635 Abnormal weight gain: Secondary | ICD-10-CM | POA: Diagnosis not present

## 2016-09-27 DIAGNOSIS — I10 Essential (primary) hypertension: Secondary | ICD-10-CM

## 2016-09-27 DIAGNOSIS — R0601 Orthopnea: Secondary | ICD-10-CM

## 2016-09-27 DIAGNOSIS — E669 Obesity, unspecified: Secondary | ICD-10-CM

## 2016-09-27 DIAGNOSIS — R0602 Shortness of breath: Secondary | ICD-10-CM

## 2016-09-27 DIAGNOSIS — D509 Iron deficiency anemia, unspecified: Secondary | ICD-10-CM

## 2016-09-27 DIAGNOSIS — R0609 Other forms of dyspnea: Secondary | ICD-10-CM

## 2016-09-27 DIAGNOSIS — Z0000 Encounter for general adult medical examination without abnormal findings: Secondary | ICD-10-CM

## 2016-09-27 DIAGNOSIS — R609 Edema, unspecified: Secondary | ICD-10-CM

## 2016-09-27 DIAGNOSIS — D649 Anemia, unspecified: Secondary | ICD-10-CM

## 2016-09-27 LAB — BRAIN NATRIURETIC PEPTIDE: PRO B NATRI PEPTIDE: 41 pg/mL (ref 0.0–100.0)

## 2016-09-27 LAB — COMPREHENSIVE METABOLIC PANEL
ALK PHOS: 89 U/L (ref 39–117)
ALT: 17 U/L (ref 0–35)
AST: 22 U/L (ref 0–37)
Albumin: 3.8 g/dL (ref 3.5–5.2)
BILIRUBIN TOTAL: 0.2 mg/dL (ref 0.2–1.2)
BUN: 7 mg/dL (ref 6–23)
CO2: 29 mEq/L (ref 19–32)
Calcium: 9.1 mg/dL (ref 8.4–10.5)
Chloride: 104 mEq/L (ref 96–112)
Creatinine, Ser: 0.9 mg/dL (ref 0.40–1.20)
GFR: 86.11 mL/min (ref >60.00)
GLUCOSE: 84 mg/dL (ref 70–99)
Potassium: 4.2 mEq/L (ref 3.5–5.1)
SODIUM: 138 meq/L (ref 135–145)
TOTAL PROTEIN: 6.9 g/dL (ref 6.0–8.3)

## 2016-09-27 LAB — CBC WITH DIFFERENTIAL/PLATELET
BASOS PCT: 0.6 % (ref 0.0–3.0)
Basophils Absolute: 0.1 10*3/uL (ref 0.0–0.1)
EOS ABS: 0.2 10*3/uL (ref 0.0–0.7)
EOS PCT: 2.3 % (ref 0.0–5.0)
HCT: 31.1 % — ABNORMAL LOW (ref 36.0–46.0)
HEMOGLOBIN: 10.1 g/dL — AB (ref 12.0–15.0)
LYMPHS PCT: 26.7 % (ref 12.0–46.0)
Lymphs Abs: 2.2 10*3/uL (ref 0.7–4.0)
MCHC: 32.6 g/dL (ref 30.0–36.0)
MCV: 75.2 fl — AB (ref 78.0–100.0)
MONOS PCT: 9.5 % (ref 3.0–12.0)
Monocytes Absolute: 0.8 10*3/uL (ref 0.1–1.0)
NEUTROS ABS: 5.1 10*3/uL (ref 1.4–7.7)
Neutrophils Relative %: 60.9 % (ref 43.0–77.0)
Platelets: 480 10*3/uL — ABNORMAL HIGH (ref 150.0–400.0)
RBC: 4.14 Mil/uL (ref 3.87–5.11)
RDW: 16.1 % — AB (ref 11.5–15.5)
WBC: 8.3 10*3/uL (ref 4.0–10.5)

## 2016-09-27 LAB — POCT GLYCOSYLATED HEMOGLOBIN (HGB A1C): Hemoglobin A1C: 5.5

## 2016-09-27 LAB — LIPID PANEL
Cholesterol: 154 mg/dL (ref 0–200)
HDL: 37 mg/dL — ABNORMAL LOW (ref >39.00)
NonHDL: 117.14
Total CHOL/HDL Ratio: 4
Triglycerides: 258 mg/dL — ABNORMAL HIGH (ref 0.0–149.0)
VLDL: 51.6 mg/dL — ABNORMAL HIGH (ref 0.0–40.0)

## 2016-09-27 LAB — LDL CHOLESTEROL, DIRECT: Direct LDL: 72 mg/dL

## 2016-09-27 LAB — POCT URINE PREGNANCY: Preg Test, Ur: NEGATIVE

## 2016-09-27 MED ORDER — MOMETASONE FUROATE 50 MCG/ACT NA SUSP: 2.0000 | Freq: Every day | NASAL | 12 refills | Status: DC

## 2016-09-27 MED ORDER — NEBIVOLOL HCL 5 MG PO TABS: 5.0000 mg | ORAL_TABLET | Freq: Every day | ORAL | 3 refills | Status: DC

## 2016-09-27 MED ORDER — ALPRAZOLAM 0.25 MG PO TABS: 0.2500 mg | ORAL_TABLET | Freq: Two times a day (BID) | ORAL | 4 refills | Status: DC | PRN

## 2016-09-27 MED ORDER — SPIRONOLACTONE 25 MG PO TABS: 25.0000 mg | ORAL_TABLET | Freq: Every day | ORAL | 0 refills | Status: DC

## 2016-09-27 NOTE — Patient Instructions (Addendum)
If your labs and chest x ray are not revealing of a source for your shortness of breath,  I will recommend that you see Dr Rockey Situ for an ECHO  I have prescribed aldactone to use as needed for your fluid retention  Continue the bystolic

## 2016-09-27 NOTE — Progress Notes (Signed)
Patient ID: Theresa Fox, female    DOB: 1971-06-13  Age: 46 y.o. MRN: [id number]  The patient is here for annual  wellness examination   LAST SEEN  NOV 2015  The risk factors are reflected in the social history.  The roster of all physicians providing medical care to patient - is listed in the Snapshot section of the chart.  Home safety : The patient has smoke detectors in the home. They wear seatbelts.  There are no firearms at home. There is no violence in the home.   There is no risks for hepatitis, STDs or HIV. There is no   history of blood transfusion. They have no travel history to infectious disease endemic areas of the world.  The patient has seen their dentist in the last six month. They have seen their eye doctor in the last year. They admit to slight hearing difficulty with regard to whispered voices and some television programs.  They have deferred audiologic testing in the last year.  They do not  have excessive sun exposure. Discussed the need for sun protection: hats, long sleeves and use of sunscreen if there is significant sun exposure.   Diet: the importance of a healthy diet is discussed. They do have a healthy diet.  The benefits of regular aerobic exercise were discussed. She exercises regularly    Depression screen: there are no signs or vegative symptoms of depression- irritability, change in appetite, anhedonia, sadness/tearfullness.  The following portions of the patient's history were reviewed and updated as appropriate: allergies, current medications, past family history, past medical history,  past surgical history, past social history  and problem list.  Visual acuity was not assessed per patient preference since she has regular follow up with her ophthalmologist. Hearing and body mass index were assessed and reviewed.   During the course of the visit the patient was educated and counseled about appropriate screening and preventive services including : fall  prevention , diabetes screening, nutrition counseling, colorectal cancer screening, and recommended immunizations.    CC: The primary encounter diagnosis was Encounter for preventive health examination. Diagnoses of Edema, unspecified type, Orthopnea, Shortness of breath, Weight gain, Anemia, unspecified type, Obesity (BMI 35.0-39.9 without comorbidity), Essential hypertension, Exertional dyspnea, and Microcytic anemia were also pertinent to this visit.  SHORTNESS OF BREATH WHILE USING TREADMILL STARTED TWO WEEKS AGO,  BUT ALSO WHILE TALKING GOING UP A FLIGHT OF STAIRS. SOMETIMES OCCUR WHILE LYING IN BED.  IMPROVES   WITH DEEP BREATHING.  NO RECENT TRAVEL.  NOT ON BIRTH CONTROL.  NO FH OF BLOD CLOT. Marland Kitchen  FATHER DIED SUDDENLY IN HIS 50'S  UNKNOWN CAUSE  EGG WHITES AND [place] SAUGS 8:30   RAPID WEIGHT GAIN STARTED  IN late november with a ten lb wt gain in one month . States that she only missed 2 WEEKS OF EXERCISE DURING THAT TIME .EXERCISES 4 DAYS PER WEEK VIGOROUSLY   Following a low glycemic index diet , reduce salt , vegetable based. ,    History Kiaja has a past medical history of Supraventricular tachycardia, nonsustained ([place]).   She has no past surgical history on file.   Her family history includes Coronary artery disease in her father; Hypertension in her father and mother.She reports that she has never smoked. She has never used smokeless tobacco. She reports that she drinks about 3.5 oz of alcohol per week . She reports that she does not use drugs.  Outpatient Medications Prior to Visit  Medication Sig Dispense  Refill  . ALPRAZolam (XANAX) 0.25 MG tablet Take 1 tablet (0.25 mg total) by mouth 2 (two) times daily as needed for anxiety. 60 tablet 4  . cyanocobalamin (,VITAMIN B-12,) 1000 MCG/ML injection Inject 1 mL (1,000 mcg total) into the muscle once a week. (Patient not taking: Reported on 09/27/2016) 10 mL 1  . CYANOCOBALAMIN IJ Inject as directed every 30 (thirty) days.       . mometasone (NASONEX) 50 MCG/ACT nasal spray Place 2 sprays into the nose daily. 17 g 12  . nebivolol (BYSTOLIC) 5 MG tablet Take 1 tablet (5 mg total) by mouth daily. (Patient not taking: Reported on 09/27/2016) 90 tablet 3   No facility-administered medications prior to visit.     Review of Systems   Patient denies headache, fevers, malaise, unintentional weight loss, skin rash, eye pain, sinus congestion and sinus pain, sore throat, dysphagia,  hemoptysis , cough, , wheezing, chest pain, palpitations, orthopnea, , abdominal pain, nausea, melena, diarrhea, constipation, flank pain, dysuria, hematuria, urinary  Frequency, nocturia, numbness, tingling, seizures,  Focal weakness, Loss of consciousness,  Tremor, insomnia, depression, anxiety, and suicidal ideation.      Objective:  BP 120/82   Pulse 93   Resp 16   Ht '5\' 2"'$  (1.575 m)   Wt 198 lb (89.8 kg)   SpO2 97%   BMI 36.21 kg/m   Physical Exam   General appearance: alert, cooperative and appears stated age Head: Normocephalic, without obvious abnormality, atraumatic Eyes: conjunctivae/corneas clear. PERRL, EOM's intact. Fundi benign. Ears: normal TM's and external ear canals both ears Nose: Nares normal. Septum midline. Mucosa normal. No drainage or sinus tenderness. Throat: lips, mucosa, and tongue normal; teeth and gums normal Neck: no adenopathy, no carotid bruit, no JVD, supple, symmetrical, trachea midline and thyroid not enlarged, symmetric, no tenderness/mass/nodules Lungs: clear to auscultation bilaterally Breasts: normal appearance, no masses or tenderness Heart: regular rate and rhythm, S1, S2 normal, no murmur, click, rub or gallop Abdomen: soft, non-tender; bowel sounds normal; no masses,  no organomegaly Extremities: extremities atraumatic, no cyanosis 1+ pitting  edema Pulses: 2+ and symmetric Skin: Skin color, texture, turgor normal. No rashes or lesions Neurologic: Alert and oriented X 3, normal strength and  tone. Normal symmetric reflexes. Normal coordination and gait.      Assessment & Plan:   Problem List Items Addressed This Visit    Edema    Etiology unclear,  rx spironolactone prn       Encounter for preventive health examination - Primary    Annual comprehensive preventive exam was done as well as an evaluation and management of chronic conditions .  During the course of the visit the patient was educated and counseled about appropriate screening and preventive services including :  diabetes screening, lipid analysis with projected  10 year  risk for CAD , nutrition counseling, breast, cervical and colorectal cancer screening, and recommended immunizations.  Printed recommendations for health maintenance screenings was given      Exertional dyspnea    With orthopnea and edema also reported.  ddx includes  anemia vs cardiomyopathy, vs pulmonary disease .  Chest x ray normal. Prior stress test normal 2012.  Lab Results  Component Value Date   WBC 8.3 09/27/2016   HGB 10.1 (L) 09/27/2016   HCT 31.1 (L) 09/27/2016   MCV 75.2 (L) 09/27/2016   PLT 480.0 (H) 09/27/2016         Hypertension    Well controlled on current regimen. Renal function  stable, no changes today..   Lab Results  Component Value Date   CREATININE 0.90 09/27/2016   Lab Results  Component Value Date   NA 138 09/27/2016   K 4.2 09/27/2016   CL 104 09/27/2016   CO2 29 09/27/2016         Relevant Medications   nebivolol (BYSTOLIC) 5 MG tablet   spironolactone (ALDACTONE) 25 MG tablet   Microcytic anemia    Suggestive of iron deficiency,  But will screen for B12 and hemoglobinopathies as well       Obesity (BMI 35.0-39.9 without comorbidity)    I have addressed  BMI and recommended wt loss of 10% of body weigh over the next 6 months using a low glycemic index diet and regular exercise a minimum of 5 days per week.   Lab Results  Component Value Date   TSH 1.350 09/27/2016   Lab Results   Component Value Date   HGBA1C 5.5 09/27/2016          Other Visit Diagnoses    Orthopnea       Shortness of breath       Relevant Orders   CBC with Differential/Platelet (Completed)   B Nat Peptide (Completed)   DG Chest 2 View (Completed)   POCT urine pregnancy (Completed)   Weight gain       Relevant Orders   T4 AND TSH (Completed)   Lipid panel (Completed)   POCT HgB A1C (Completed)   Comp Met (CMET) (Completed)   Anemia, unspecified type       Relevant Orders   CBC with Differential/Platelet   Erythropoietin   Ferritin   Folate RBC   Iron and TIBC   Reticulocytes   Vitamin B12   Protein electrophoresis, serum      I am having Ms. Makela start on spironolactone. I am also having her maintain her CYANOCOBALAMIN IJ, cyanocobalamin, MULTI-VITAMINS, Coenzyme Q10 (CO Q 10 PO), ALPRAZolam, nebivolol, and mometasone.  Meds ordered this encounter  Medications  . Multiple Vitamin (MULTI-VITAMINS) TABS    Sig: Take 1 tablet by mouth daily.  . Coenzyme Q10 (CO Q 10 PO)    Sig: Take 1 each by mouth daily.  Marland Kitchen ALPRAZolam (XANAX) 0.25 MG tablet    Sig: Take 1 tablet (0.25 mg total) by mouth 2 (two) times daily as needed for anxiety.    Dispense:  60 tablet    Refill:  4  . nebivolol (BYSTOLIC) 5 MG tablet    Sig: Take 1 tablet (5 mg total) by mouth daily.    Dispense:  90 tablet    Refill:  3  . mometasone (NASONEX) 50 MCG/ACT nasal spray    Sig: Place 2 sprays into the nose daily.    Dispense:  17 g    Refill:  12  . spironolactone (ALDACTONE) 25 MG tablet    Sig: Take 1 tablet (25 mg total) by mouth daily. AS NEEDED FOR FLUID RETENTION    Dispense:  90 tablet    Refill:  0    Medications Discontinued During This Encounter  Medication Reason  . ALPRAZolam (XANAX) 0.25 MG tablet Reorder  . nebivolol (BYSTOLIC) 5 MG tablet Reorder  . mometasone (NASONEX) 50 MCG/ACT nasal spray Reorder    Follow-up: No Follow-up on file.   Crecencio Mc, MD

## 2016-09-27 NOTE — Progress Notes (Signed)
Pre visit review using our clinic review tool, if applicable. No additional management support is needed unless otherwise documented below in the visit note. 

## 2016-09-28 ENCOUNTER — Encounter: Payer: Self-pay | Admitting: Internal Medicine

## 2016-09-28 LAB — T4 AND TSH
T4, Total: 6.1 ug/dL (ref 4.5–12.0)
TSH: 1.35 u[IU]/mL (ref 0.450–4.500)

## 2016-09-29 DIAGNOSIS — R609 Edema, unspecified: Secondary | ICD-10-CM | POA: Insufficient documentation

## 2016-09-29 DIAGNOSIS — R0609 Other forms of dyspnea: Secondary | ICD-10-CM

## 2016-09-29 DIAGNOSIS — D509 Iron deficiency anemia, unspecified: Secondary | ICD-10-CM | POA: Insufficient documentation

## 2016-09-29 NOTE — Assessment & Plan Note (Signed)
Well controlled on current regimen. Renal function stable, no changes today..   Lab Results  Component Value Date   CREATININE 0.90 09/27/2016   Lab Results  Component Value Date   NA 138 09/27/2016   K 4.2 09/27/2016   CL 104 09/27/2016   CO2 29 09/27/2016

## 2016-09-29 NOTE — Assessment & Plan Note (Signed)
I have addressed  BMI and recommended wt loss of 10% of body weigh over the next 6 months using a low glycemic index diet and regular exercise a minimum of 5 days per week.   Lab Results  Component Value Date   TSH 1.350 09/27/2016   Lab Results  Component Value Date   HGBA1C 5.5 09/27/2016

## 2016-09-29 NOTE — Assessment & Plan Note (Addendum)
With orthopnea and edema also reported.  ddx includes  anemia vs cardiomyopathy, vs pulmonary disease .  Chest x ray normal. Prior stress test normal 2012.  Lab Results  Component Value Date   WBC 8.3 09/27/2016   HGB 10.1 (L) 09/27/2016   HCT 31.1 (L) 09/27/2016   MCV 75.2 (L) 09/27/2016   PLT 480.0 (H) 09/27/2016

## 2016-09-29 NOTE — Assessment & Plan Note (Signed)
Annual comprehensive preventive exam was done as well as an evaluation and management of chronic conditions .  During the course of the visit the patient was educated and counseled about appropriate screening and preventive services including :  diabetes screening, lipid analysis with projected  10 year  risk for CAD , nutrition counseling, breast, cervical and colorectal cancer screening, and recommended immunizations.  Printed recommendations for health maintenance screenings was given 

## 2016-09-29 NOTE — Assessment & Plan Note (Signed)
Etiology unclear,  rx spironolactone prn

## 2016-09-29 NOTE — Assessment & Plan Note (Signed)
Suggestive of iron deficiency,  But will screen for B12 and hemoglobinopathies as well

## 2016-10-03 ENCOUNTER — Other Ambulatory Visit (INDEPENDENT_AMBULATORY_CARE_PROVIDER_SITE_OTHER): Payer: 59

## 2016-10-03 DIAGNOSIS — D649 Anemia, unspecified: Secondary | ICD-10-CM | POA: Diagnosis not present

## 2016-10-03 LAB — RETICULOCYTES
ABS Retic: 65250 cells/uL (ref 20000–80000)
RBC.: 4.35 MIL/uL (ref 3.80–5.10)
RETIC CT PCT: 1.5 %

## 2016-10-03 LAB — CBC WITH DIFFERENTIAL/PLATELET
Basophils Absolute: 0.1 10*3/uL (ref 0.0–0.1)
Basophils Relative: 0.7 % (ref 0.0–3.0)
EOS PCT: 1.8 % (ref 0.0–5.0)
Eosinophils Absolute: 0.2 10*3/uL (ref 0.0–0.7)
HEMATOCRIT: 31.7 % — AB (ref 36.0–46.0)
Hemoglobin: 10.4 g/dL — ABNORMAL LOW (ref 12.0–15.0)
LYMPHS PCT: 31.6 % (ref 12.0–46.0)
Lymphs Abs: 2.6 10*3/uL (ref 0.7–4.0)
MCHC: 32.7 g/dL (ref 30.0–36.0)
MCV: 75.4 fl — AB (ref 78.0–100.0)
MONOS PCT: 7.8 % (ref 3.0–12.0)
Monocytes Absolute: 0.6 10*3/uL (ref 0.1–1.0)
NEUTROS ABS: 4.8 10*3/uL (ref 1.4–7.7)
NEUTROS PCT: 58.1 % (ref 43.0–77.0)
Platelets: 498 10*3/uL — ABNORMAL HIGH (ref 150.0–400.0)
RBC: 4.2 Mil/uL (ref 3.87–5.11)
RDW: 16.1 % — ABNORMAL HIGH (ref 11.5–15.5)
WBC: 8.2 10*3/uL (ref 4.0–10.5)

## 2016-10-03 LAB — FERRITIN: FERRITIN: 6.7 ng/mL — AB (ref 10.0–291.0)

## 2016-10-03 LAB — VITAMIN B12: Vitamin B-12: 306 pg/mL (ref 211–911)

## 2016-10-04 LAB — FOLATE RBC: RBC Folate: 595 ng/mL (ref >280)

## 2016-10-04 LAB — IRON AND TIBC
%SAT: 4 % — ABNORMAL LOW (ref 11–50)
IRON: 17 ug/dL — AB (ref 40–190)
TIBC: 461 ug/dL — AB (ref 250–450)
UIBC: 444 ug/dL — AB (ref 125–400)

## 2016-10-05 LAB — PROTEIN ELECTROPHORESIS, SERUM
ALPHA-1-GLOBULIN: 0.3 g/dL (ref 0.2–0.3)
ALPHA-2-GLOBULIN: 0.6 g/dL (ref 0.5–0.9)
Albumin ELP: 3.7 g/dL — ABNORMAL LOW (ref 3.8–4.8)
BETA GLOBULIN: 0.5 g/dL (ref 0.4–0.6)
Beta 2: 0.4 g/dL (ref 0.2–0.5)
GAMMA GLOBULIN: 1.3 g/dL (ref 0.8–1.7)
Total Protein, Serum Electrophoresis: 6.8 g/dL (ref 6.1–8.1)

## 2016-10-05 LAB — ERYTHROPOIETIN: Erythropoietin: 41.9 m[IU]/mL — ABNORMAL HIGH (ref 2.6–18.5)

## 2016-10-07 ENCOUNTER — Encounter: Payer: Self-pay | Admitting: Internal Medicine

## 2016-10-07 ENCOUNTER — Other Ambulatory Visit: Payer: Self-pay | Admitting: Internal Medicine

## 2016-10-07 DIAGNOSIS — D509 Iron deficiency anemia, unspecified: Secondary | ICD-10-CM

## 2016-12-17 ENCOUNTER — Other Ambulatory Visit: Payer: Self-pay | Admitting: Internal Medicine

## 2016-12-18 MED ORDER — SPIRONOLACTONE 25 MG PO TABS: 25.0000 mg | ORAL_TABLET | Freq: Every day | ORAL | 0 refills | Status: DC

## 2016-12-20 ENCOUNTER — Telehealth: Payer: Self-pay

## 2016-12-20 MED ORDER — NEBIVOLOL HCL 5 MG PO TABS: 5.0000 mg | ORAL_TABLET | Freq: Every day | ORAL | 0 refills | Status: DC

## 2016-12-20 NOTE — Telephone Encounter (Signed)
refilled 

## 2016-12-21 ENCOUNTER — Ambulatory Visit: Payer: Self-pay | Admitting: Obstetrics & Gynecology

## 2016-12-21 ENCOUNTER — Encounter: Payer: Self-pay | Admitting: Obstetrics & Gynecology

## 2016-12-24 ENCOUNTER — Other Ambulatory Visit: Payer: Self-pay

## 2016-12-24 MED ORDER — SPIRONOLACTONE 25 MG PO TABS: 25.0000 mg | ORAL_TABLET | Freq: Every day | ORAL | 0 refills | Status: DC

## 2017-02-12 ENCOUNTER — Ambulatory Visit (INDEPENDENT_AMBULATORY_CARE_PROVIDER_SITE_OTHER): Payer: 59 | Admitting: *Deleted

## 2017-02-12 DIAGNOSIS — Z111 Encounter for screening for respiratory tuberculosis: Secondary | ICD-10-CM

## 2017-02-12 NOTE — Progress Notes (Signed)
Patient in for first of a 2 step PPD test, read on Thursday 02/14/17. Patient advised second step 1 week later.

## 2017-02-14 ENCOUNTER — Ambulatory Visit: Payer: 59 | Admitting: *Deleted

## 2017-02-14 DIAGNOSIS — Z111 Encounter for screening for respiratory tuberculosis: Secondary | ICD-10-CM

## 2017-02-14 LAB — TB SKIN TEST
Induration: 0 mm
TB SKIN TEST: NEGATIVE

## 2017-02-14 NOTE — Progress Notes (Signed)
Patient presented for PPD read will return in one week for second step PPD.

## 2017-02-19 ENCOUNTER — Ambulatory Visit (INDEPENDENT_AMBULATORY_CARE_PROVIDER_SITE_OTHER): Payer: 59 | Admitting: *Deleted

## 2017-02-19 DIAGNOSIS — Z111 Encounter for screening for respiratory tuberculosis: Secondary | ICD-10-CM

## 2017-02-19 NOTE — Progress Notes (Signed)
Patient presented fro second part of 2 step PPD testing.

## 2017-02-21 ENCOUNTER — Ambulatory Visit (INDEPENDENT_AMBULATORY_CARE_PROVIDER_SITE_OTHER): Payer: 59 | Admitting: *Deleted

## 2017-02-21 ENCOUNTER — Other Ambulatory Visit: Payer: Self-pay | Admitting: Internal Medicine

## 2017-02-21 DIAGNOSIS — Z111 Encounter for screening for respiratory tuberculosis: Secondary | ICD-10-CM

## 2017-02-21 LAB — TB SKIN TEST
INDURATION: 0 mm
TB Skin Test: NEGATIVE

## 2017-02-21 NOTE — Progress Notes (Signed)
Patient ion for PPD read , which showed no induration.

## 2017-02-25 ENCOUNTER — Other Ambulatory Visit: Payer: Self-pay | Admitting: Internal Medicine

## 2017-09-30 ENCOUNTER — Ambulatory Visit (INDEPENDENT_AMBULATORY_CARE_PROVIDER_SITE_OTHER): Payer: 59 | Admitting: Internal Medicine

## 2017-09-30 ENCOUNTER — Encounter: Payer: Self-pay | Admitting: Internal Medicine

## 2017-09-30 VITALS — BP 110/76 | HR 86 | Temp 98.1°F | Resp 15 | Ht 62.0 in | Wt 194.0 lb

## 2017-09-30 DIAGNOSIS — Z23 Encounter for immunization: Secondary | ICD-10-CM

## 2017-09-30 DIAGNOSIS — I10 Essential (primary) hypertension: Secondary | ICD-10-CM

## 2017-09-30 DIAGNOSIS — Z0000 Encounter for general adult medical examination without abnormal findings: Secondary | ICD-10-CM | POA: Diagnosis not present

## 2017-09-30 DIAGNOSIS — Z1231 Encounter for screening mammogram for malignant neoplasm of breast: Secondary | ICD-10-CM

## 2017-09-30 DIAGNOSIS — R5383 Other fatigue: Secondary | ICD-10-CM | POA: Diagnosis not present

## 2017-09-30 DIAGNOSIS — D509 Iron deficiency anemia, unspecified: Secondary | ICD-10-CM

## 2017-09-30 DIAGNOSIS — Z13220 Encounter for screening for lipoid disorders: Secondary | ICD-10-CM | POA: Diagnosis not present

## 2017-09-30 DIAGNOSIS — E669 Obesity, unspecified: Secondary | ICD-10-CM | POA: Diagnosis not present

## 2017-09-30 DIAGNOSIS — Z1239 Encounter for other screening for malignant neoplasm of breast: Secondary | ICD-10-CM

## 2017-09-30 LAB — LIPID PANEL
Cholesterol: 145 mg/dL (ref 0–200)
HDL: 41.1 mg/dL (ref >39.00)
LDL CALC: 75 mg/dL (ref 0–99)
NONHDL: 103.65
Total CHOL/HDL Ratio: 4
Triglycerides: 142 mg/dL (ref 0.0–149.0)
VLDL: 28.4 mg/dL (ref 0.0–40.0)

## 2017-09-30 LAB — CBC WITH DIFFERENTIAL/PLATELET
BASOS ABS: 41 {cells}/uL (ref 0–200)
Basophils Relative: 0.5 %
EOS PCT: 1.5 %
Eosinophils Absolute: 123 cells/uL (ref 15–500)
HEMATOCRIT: 35.6 % (ref 35.0–45.0)
Hemoglobin: 11.7 g/dL (ref 11.7–15.5)
LYMPHS ABS: 2058 {cells}/uL (ref 850–3900)
MCH: 26.4 pg — ABNORMAL LOW (ref 27.0–33.0)
MCHC: 32.9 g/dL (ref 32.0–36.0)
MCV: 80.4 fL (ref 80.0–100.0)
MPV: 10.7 fL (ref 7.5–12.5)
Monocytes Relative: 7.8 %
NEUTROS PCT: 65.1 %
Neutro Abs: 5338 cells/uL (ref 1500–7800)
Platelets: 387 10*3/uL (ref 140–400)
RBC: 4.43 10*6/uL (ref 3.80–5.10)
RDW: 14.5 % (ref 11.0–15.0)
TOTAL LYMPHOCYTE: 25.1 %
WBC mixed population: 640 cells/uL (ref 200–950)
WBC: 8.2 10*3/uL (ref 3.8–10.8)

## 2017-09-30 LAB — COMPREHENSIVE METABOLIC PANEL
ALK PHOS: 72 U/L (ref 39–117)
ALT: 19 U/L (ref 0–35)
AST: 21 U/L (ref 0–37)
Albumin: 3.8 g/dL (ref 3.5–5.2)
BILIRUBIN TOTAL: 0.2 mg/dL (ref 0.2–1.2)
BUN: 11 mg/dL (ref 6–23)
CO2: 25 mEq/L (ref 19–32)
CREATININE: 0.98 mg/dL (ref 0.40–1.20)
Calcium: 8.9 mg/dL (ref 8.4–10.5)
Chloride: 106 mEq/L (ref 96–112)
GFR: 77.72 mL/min (ref >60.00)
Glucose, Bld: 100 mg/dL — ABNORMAL HIGH (ref 70–99)
Potassium: 4.1 mEq/L (ref 3.5–5.1)
SODIUM: 139 meq/L (ref 135–145)
TOTAL PROTEIN: 7.2 g/dL (ref 6.0–8.3)

## 2017-09-30 LAB — TSH: TSH: 2.28 u[IU]/mL (ref 0.35–4.50)

## 2017-09-30 NOTE — Assessment & Plan Note (Signed)
Annual comprehensive preventive exam was done as well as an evaluation and management of chronic conditions .  During the course of the visit the patient was educated and counseled about appropriate screening and preventive services including :  diabetes screening, lipid analysis with projected  10 year  risk for CAD , nutrition counseling, breast, cervical and colorectal cancer screening, and recommended immunizations.  Printed recommendations for health maintenance screenings was given 

## 2017-09-30 NOTE — Assessment & Plan Note (Addendum)
Last year's workup was Suggestive of iron deficiency,.  She ruled out for  Deficiencies of folate and B12 and hemoglobinopathies as well

## 2017-09-30 NOTE — Progress Notes (Signed)
Patient ID: Theresa Theresa Fox, female    DOB: 24-Sep-1970  Age: 47 y.o. MRN: [id number]  The patient is here for annual preventive examination and management of other chronic and acute problems.  Last mammogram  Was done in 2014 AT [place]  Last PAP was normal in 2015  BY  DR HARRIS  OVERDUE FOR  TDAP, FLU AND HIV SCREEN (declines) FLU VACCINE GIVEN TODAY  Annual eye exam  By " My eye doctor" has been  Done due to [place] of glaucoma    The risk factors are reflected in the social history.  The roster of all physicians providing medical care to patient - is listed in the Snapshot section of the chart.  Home safety : The patient has smoke detectors in the home. They wear seatbelts.  There are no firearms at home. There is no violence in the home.   There is no risks for hepatitis, STDs or HIV. There is no   history of blood transfusion. They have no travel history to infectious disease endemic areas of the world.  The patient has seen their dentist in the last six month. They have seen their eye doctor in the last year.  Discussed the need for sun protection: hats, long sleeves and use of sunscreen if there is significant sun exposure.   Diet: the importance of a healthy diet is discussed. They do have a healthy diet.  The benefits of regular aerobic exercise were discussed. She walks 4 times per week ,  20 minutes.   Depression screen: there are no signs or vegative symptoms of depression- irritability, change in appetite, anhedonia, sadness/tearfullness.  Cognitive assessment: the patient manages all their financial and personal affairs and is actively engaged. They could relate day,date,year and events; recalled 2/3 objects at 3 minutes; performed clock-face test normally.  The following portions of the patient's history were reviewed and updated as appropriate: allergies, current medications, past family history, past medical history,  past surgical history, past social history  and problem  list.  Visual acuity was not assessed per patient preference since she has regular follow up with her ophthalmologist. Hearing and body mass index were assessed and reviewed.   During the course of the visit the patient was educated and counseled about appropriate screening and preventive services including : fall prevention , diabetes screening, nutrition counseling, colorectal cancer screening, and recommended immunizations.    CC: The primary encounter diagnosis was Breast cancer screening. Diagnoses of Essential hypertension, Microcytic anemia, Screening for hyperlipidemia, Fatigue, unspecified type, Need for immunization against influenza, Need for diphtheria-tetanus-pertussis (Tdap) vaccine, Encounter for preventive health examination, and Obesity (BMI 35.0-39.9 without comorbidity) were also pertinent to this visit.  USING  Bystolic ONCE A WEEK  for rapid heart rate. Takes it for pulse > 90  .  started running 3 to 5 miles daily in November .  Took 3 weeks off for holidays and just started back.  Wt loss of 4 lbs,  Goal is 167 (last year's weight)    History Theresa Theresa Fox has a past medical history of Supraventricular tachycardia, nonsustained (Theresa Fox).   She has a past surgical history that includes Cesarean section classical.   Her family history includes Coronary artery disease in her father; Hypertension in her father and mother.She reports that  has never smoked. she has never used smokeless tobacco. She reports that she drinks about 3.5 oz of alcohol per week. She reports that she does not use drugs.  Outpatient Medications Prior to Visit  Medication Sig Dispense Refill  . ALPRAZolam (XANAX) 0.25 MG tablet Take 1 tablet (0.25 mg total) by mouth 2 (two) times daily as needed for anxiety. 60 tablet 4  . BYSTOLIC 5 MG tablet TAKE 1 TABLET BY MOUTH  DAILY 90 tablet 1  . spironolactone (ALDACTONE) 25 MG tablet TAKE 1 TABLET BY MOUTH  DAILY AS NEEDED FOR FLUID  RETENTION 90 tablet 1  . Coenzyme  Q10 (CO Q 10 PO) Take 1 each by mouth daily.    . cyanocobalamin (,VITAMIN B-12,) 1000 MCG/ML injection Inject 1 mL (1,000 mcg total) into the muscle once a week. (Patient not taking: Reported on 09/27/2016) 10 mL 1  . CYANOCOBALAMIN IJ Inject as directed every 30 (thirty) days.      . mometasone (NASONEX) 50 MCG/ACT nasal spray Place 2 sprays into the nose daily. 17 g 12  . Multiple Vitamin (MULTI-VITAMINS) TABS Take 1 tablet by mouth daily.     No facility-administered medications prior to visit.     Review of Systems  Patient denies headache, fevers, malaise, unintentional weight loss, skin rash, eye pain, sinus congestion and sinus pain, sore throat, dysphagia,  hemoptysis , cough, dyspnea, wheezing, chest pain, palpitations, orthopnea, edema, abdominal pain, nausea, melena, diarrhea, constipation, flank pain, dysuria, hematuria, urinary  Frequency, nocturia, numbness, tingling, seizures,  Focal weakness, Loss of consciousness,  Tremor, insomnia, depression, anxiety, and suicidal ideation.     Objective:  BP 110/76 (BP Location: Left Arm, Patient Position: Sitting, Cuff Size: Large)   Pulse 86   Temp 98.1 F (36.7 C) (Oral)   Resp 15   Ht 5\' 2"  (1.575 m)   Wt 194 lb (88 kg)   SpO2 98%   BMI 35.48 kg/m   Physical Exam   General appearance: alert, cooperative and appears stated age Head: Normocephalic, without obvious abnormality, atraumatic Eyes: conjunctivae/corneas clear. PERRL, EOM's intact. Fundi benign. Ears: normal TM's and external ear canals both ears Nose: Nares normal. Septum midline. Mucosa normal. No drainage or sinus tenderness. Throat: lips, mucosa, and tongue normal; teeth and gums normal Neck: no adenopathy, no carotid bruit, no JVD, supple, symmetrical, trachea midline and thyroid not enlarged, symmetric, no tenderness/mass/nodules Lungs: clear to auscultation bilaterally Breasts: normal appearance, no masses or tenderness Heart: regular rate and rhythm, S1, S2  normal, no murmur, click, rub or gallop Abdomen: soft, non-tender; bowel sounds normal; no masses,  no organomegaly Extremities: extremities normal, atraumatic, no cyanosis or edema Pulses: 2+ and symmetric Skin: Skin color, texture, turgor normal. No rashes or lesions Neurologic: Alert and oriented X 3, normal strength and tone. Normal symmetric reflexes. Normal coordination and gait.      Assessment & Plan:   Problem List Items Addressed This Visit    Encounter for preventive health examination    Annual comprehensive preventive exam was done as well as an evaluation and management of chronic conditions .  During the course of the visit the patient was educated and counseled about appropriate screening and preventive services including :  diabetes screening, lipid analysis with projected  10 year  risk for CAD , nutrition counseling, breast, cervical and colorectal cancer screening, and recommended immunizations.  Printed recommendations for health maintenance screenings was given      Obesity (BMI 35.0-39.9 without comorbidity)    I have addressed  BMI and reviewed  Her diet and exercise plan.  She is following a low glycemic index diet utilizing smaller more frequent meals to increase metabolism.  She is  Exercising  5 days per week , running 3 to 5 miles.   Screening for lipid disorders, thyroid and diabetes to be done today.         Microcytic anemia    Last year's workup was Suggestive of iron deficiency,.  She ruled out for  Deficiencies of folate and B12 and hemoglobinopathies as well         Relevant Orders   Iron, TIBC and Ferritin Panel   RESOLVED: Hypertension    Other Visit Diagnoses    Breast cancer screening    -  Primary   Relevant Orders   MM SCREENING BREAST TOMO BILATERAL   Screening for hyperlipidemia       Relevant Orders   Lipid panel   Fatigue, unspecified type       Relevant Orders   Comprehensive metabolic panel   TSH   CBC with  Differential/Platelet   Need for immunization against influenza       Relevant Orders   Flu Vaccine QUAD 36+ mos IM (Completed)   Need for diphtheria-tetanus-pertussis (Tdap) vaccine       Relevant Orders   Tdap vaccine greater than or equal to 7yo IM (Completed)      I am having Theresa Theresa Fox maintain her CYANOCOBALAMIN IJ, cyanocobalamin, MULTI-VITAMINS, Coenzyme Q10 (CO Q 10 PO), ALPRAZolam, mometasone, BYSTOLIC, and spironolactone.  No orders of the defined types were placed in this encounter.   There are no discontinued medications.  Follow-up: Return in about 1 year (around 09/30/2018) for CPE.   Crecencio Mc, MD

## 2017-09-30 NOTE — Patient Instructions (Signed)
Mammogram has been ordered  (3d this time ,  At [facility])    TDaP vaccine given  PAP smears should be done every 3 to 5 years until   you are 30   Health Maintenance, Female Adopting a healthy lifestyle and getting preventive care can go a long way to promote health and wellness. Talk with your health care provider about what schedule of regular examinations is right for you. This is a good chance for you to check in with your provider about disease prevention and staying healthy. In between checkups, there are plenty of things you can do on your own. Experts have done a lot of research about which lifestyle changes and preventive measures are most likely to keep you healthy. Ask your health care provider for more information. Weight and diet Eat a healthy diet  Be sure to include plenty of vegetables, fruits, low-fat dairy products, and lean protein.  Do not eat a lot of foods high in solid fats, added sugars, or salt.  Get regular exercise. This is one of the most important things you can do for your health. ? Most adults should exercise for at least 150 minutes each week. The exercise should increase your heart rate and make you sweat (moderate-intensity exercise). ? Most adults should also do strengthening exercises at least twice a week. This is in addition to the moderate-intensity exercise.  Maintain a healthy weight  Body mass index (BMI) is a measurement that can be used to identify possible weight problems. It estimates body fat based on height and weight. Your health care provider can help determine your BMI and help you achieve or maintain a healthy weight.  For females 3 years of age and older: ? A BMI below 18.5 is considered underweight. ? A BMI of 18.5 to 24.9 is normal. ? A BMI of 25 to 29.9 is considered overweight. ? A BMI of 30 and above is considered obese.  Watch levels of cholesterol and blood lipids  You should start having your blood tested for lipids and  cholesterol at 49 years of age, then have this test every 5 years.  You may need to have your cholesterol levels checked more often if: ? Your lipid or cholesterol levels are high. ? You are older than 50 years of age. ? You are at high risk for heart disease.  Cancer screening Lung Cancer  Lung cancer screening is recommended for adults 1-46 years old who are at high risk for lung cancer because of a history of smoking.  A yearly low-dose CT scan of the lungs is recommended for people who: ? Currently smoke. ? Have quit within the past 15 years. ? Have at least a 30-pack-year history of smoking. A pack year is smoking an average of one pack of cigarettes a day for 1 year.  Yearly screening should continue until it has been 15 years since you quit.  Yearly screening should stop if you develop a health problem that would prevent you from having lung cancer treatment.  Breast Cancer  Practice breast self-awareness. This means understanding how your breasts normally appear and feel.  It also means doing regular breast self-exams. Let your health care provider know about any changes, no matter how small.  If you are in your 20s or 30s, you should have a clinical breast exam (CBE) by a health care provider every 1-3 years as part of a regular health exam.  If you are 58 or older, have a CBE  every year. Also consider having a breast X-ray (mammogram) every year.  If you have a family history of breast cancer, talk to your health care provider about genetic screening.  If you are at high risk for breast cancer, talk to your health care provider about having an MRI and a mammogram every year.  Breast cancer gene (BRCA) assessment is recommended for women who have family members with BRCA-related cancers. BRCA-related cancers include: ? Breast. ? Ovarian. ? Tubal. ? Peritoneal cancers.  Results of the assessment will determine the need for genetic counseling and BRCA1 and BRCA2  testing.  Cervical Cancer Your health care provider may recommend that you be screened regularly for cancer of the pelvic organs (ovaries, uterus, and vagina). This screening involves a pelvic examination, including checking for microscopic changes to the surface of your cervix (Pap test). You may be encouraged to have this screening done every 3 years, beginning at age 22.  For women ages 16-65, health care providers may recommend pelvic exams and Pap testing every 3 years, or they may recommend the Pap and pelvic exam, combined with testing for human papilloma virus (HPV), every 5 years. Some types of HPV increase your risk of cervical cancer. Testing for HPV may also be done on women of any age with unclear Pap test results.  Other health care providers may not recommend any screening for nonpregnant women who are considered low risk for pelvic cancer and who do not have symptoms. Ask your health care provider if a screening pelvic exam is right for you.  If you have had past treatment for cervical cancer or a condition that could lead to cancer, you need Pap tests and screening for cancer for at least 20 years after your treatment. If Pap tests have been discontinued, your risk factors (such as having a new sexual partner) need to be reassessed to determine if screening should resume. Some women have medical problems that increase the chance of getting cervical cancer. In these cases, your health care provider may recommend more frequent screening and Pap tests.  Colorectal Cancer  This type of cancer can be detected and often prevented.  Routine colorectal cancer screening usually begins at 49 years of age and continues through 49 years of age.  Your health care provider may recommend screening at an earlier age if you have risk factors for colon cancer.  Your health care provider may also recommend using home test kits to check for hidden blood in the stool.  A small camera at the end of a  tube can be used to examine your colon directly (sigmoidoscopy or colonoscopy). This is done to check for the earliest forms of colorectal cancer.  Routine screening usually begins at age 57.  Direct examination of the colon should be repeated every 5-10 years through 49 years of age. However, you may need to be screened more often if early forms of precancerous polyps or small growths are found.  Skin Cancer  Check your skin from head to toe regularly.  Tell your health care provider about any new moles or changes in moles, especially if there is a change in a mole's shape or color.  Also tell your health care provider if you have a mole that is larger than the size of a pencil eraser.  Always use sunscreen. Apply sunscreen liberally and repeatedly throughout the day.  Protect yourself by wearing long sleeves, pants, a wide-brimmed hat, and sunglasses whenever you are outside.  Heart disease, diabetes,  and high blood pressure  High blood pressure causes heart disease and increases the risk of stroke. High blood pressure is more likely to develop in: ? People who have blood pressure in the high end of the normal range (130-139/85-89 mm Hg). ? People who are overweight or obese. ? People who are African American.  If you are 23-48 years of age, have your blood pressure checked every 3-5 years. If you are 63 years of age or older, have your blood pressure checked every year. You should have your blood pressure measured twice-once when you are at a hospital or clinic, and once when you are not at a hospital or clinic. Record the average of the two measurements. To check your blood pressure when you are not at a hospital or clinic, you can use: ? An automated blood pressure machine at a pharmacy. ? A home blood pressure monitor.  If you are between 78 years and 58 years old, ask your health care provider if you should take aspirin to prevent strokes.  Have regular diabetes screenings. This  involves taking a blood sample to check your fasting blood sugar level. ? If you are at a normal weight and have a low risk for diabetes, have this test once every three years after 49 years of age. ? If you are overweight and have a high risk for diabetes, consider being tested at a younger age or more often. Preventing infection Hepatitis B  If you have a higher risk for hepatitis B, you should be screened for this virus. You are considered at high risk for hepatitis B if: ? You were born in a country where hepatitis B is common. Ask your health care provider which countries are considered high risk. ? Your parents were born in a high-risk country, and you have not been immunized against hepatitis B (hepatitis B vaccine). ? You have HIV or AIDS. ? You use needles to inject street drugs. ? You live with someone who has hepatitis B. ? You have had sex with someone who has hepatitis B. ? You get hemodialysis treatment. ? You take certain medicines for conditions, including cancer, organ transplantation, and autoimmune conditions.  Hepatitis C  Blood testing is recommended for: ? Everyone born from [birth year] through 1967. ? Anyone with known risk factors for hepatitis C.  Sexually transmitted infections (STIs)  You should be screened for sexually transmitted infections (STIs) including gonorrhea and chlamydia if: ? You are sexually active and are younger than 49 years of age. ? You are older than 49 years of age and your health care provider tells you that you are at risk for this type of infection. ? Your sexual activity has changed since you were last screened and you are at an increased risk for chlamydia or gonorrhea. Ask your health care provider if you are at risk.  If you do not have HIV, but are at risk, it may be recommended that you take a prescription medicine daily to prevent HIV infection. This is called pre-exposure prophylaxis (PrEP). You are considered at risk if: ? You are  sexually active and do not regularly use condoms or know the HIV status of your partner(s). ? You take drugs by injection. ? You are sexually active with a partner who has HIV.  Talk with your health care provider about whether you are at high risk of being infected with HIV. If you choose to begin PrEP, you should first be tested for HIV. You should then  be tested every 3 months for as long as you are taking PrEP. Pregnancy  If you are premenopausal and you may become pregnant, ask your health care provider about preconception counseling.  If you may become pregnant, take 400 to 800 micrograms (mcg) of folic acid every day.  If you want to prevent pregnancy, talk to your health care provider about birth control (contraception). Osteoporosis and menopause  Osteoporosis is a disease in which the bones lose minerals and strength with aging. This can result in serious bone fractures. Your risk for osteoporosis can be identified using a bone density scan.  If you are 63 years of age or older, or if you are at risk for osteoporosis and fractures, ask your health care provider if you should be screened.  Ask your health care provider whether you should take a calcium or vitamin D supplement to lower your risk for osteoporosis.  Menopause may have certain physical symptoms and risks.  Hormone replacement therapy may reduce some of these symptoms and risks. Talk to your health care provider about whether hormone replacement therapy is right for you. Follow these instructions at home:  Schedule regular health, dental, and eye exams.  Stay current with your immunizations.  Do not use any tobacco products including cigarettes, chewing tobacco, or electronic cigarettes.  If you are pregnant, do not drink alcohol.  If you are breastfeeding, limit how much and how often you drink alcohol.  Limit alcohol intake to no more than 1 drink per day for nonpregnant women. One drink equals 12 ounces of  beer, 5 ounces of wine, or 1 ounces of hard liquor.  Do not use street drugs.  Do not share needles.  Ask your health care provider for help if you need support or information about quitting drugs.  Tell your health care provider if you often feel depressed.  Tell your health care provider if you have ever been abused or do not feel safe at home. This information is not intended to replace advice given to you by your health care provider. Make sure you discuss any questions you have with your health care provider. Document Released: 02/19/2011 Document Revised: 01/12/2016 Document Reviewed: 05/10/2015 Elsevier Interactive Patient Education  [employer].

## 2017-09-30 NOTE — Assessment & Plan Note (Signed)
I have addressed  BMI and reviewed  Her diet and exercise plan.  She is following a low glycemic index diet utilizing smaller more frequent meals to increase metabolism.  She is  Exercising 5 days per week , running 3 to 5 miles.   Screening for lipid disorders, thyroid and diabetes to be done today.

## 2017-10-01 LAB — IRON,TIBC AND FERRITIN PANEL
%SAT: 5 % (calc) — ABNORMAL LOW (ref 11–50)
Ferritin: 8 ng/mL — ABNORMAL LOW (ref 10–232)
Iron: 20 ug/dL — ABNORMAL LOW (ref 40–190)
TIBC: 388 mcg/dL (calc) (ref 250–450)

## 2017-10-02 ENCOUNTER — Encounter: Payer: Self-pay | Admitting: Internal Medicine

## 2017-12-02 ENCOUNTER — Other Ambulatory Visit: Payer: Self-pay | Admitting: Internal Medicine

## 2018-01-07 ENCOUNTER — Ambulatory Visit
Admission: RE | Admit: 2018-01-07 | Discharge: 2018-01-07 | Disposition: A | Discharge status: Home / Self Care | Payer: 59 | Source: Ambulatory Visit | Attending: Internal Medicine | Admitting: Internal Medicine

## 2018-01-07 DIAGNOSIS — Z1239 Encounter for other screening for malignant neoplasm of breast: Secondary | ICD-10-CM

## 2018-01-07 DIAGNOSIS — Z1231 Encounter for screening mammogram for malignant neoplasm of breast: Secondary | ICD-10-CM | POA: Insufficient documentation

## 2018-01-08 ENCOUNTER — Other Ambulatory Visit: Payer: Self-pay | Admitting: *Deleted

## 2018-01-08 ENCOUNTER — Encounter: Payer: Self-pay | Admitting: Obstetrics & Gynecology

## 2018-01-08 ENCOUNTER — Ambulatory Visit (INDEPENDENT_AMBULATORY_CARE_PROVIDER_SITE_OTHER): Payer: 59 | Admitting: Obstetrics & Gynecology

## 2018-01-08 ENCOUNTER — Ambulatory Visit
Admission: RE | Admit: 2018-01-08 | Discharge: 2018-01-08 | Disposition: A | Discharge status: Home / Self Care | Payer: Self-pay | Source: Ambulatory Visit | Attending: *Deleted | Admitting: *Deleted

## 2018-01-08 VITALS — BP 120/80 | Ht 62.0 in | Wt 200.0 lb

## 2018-01-08 DIAGNOSIS — Z124 Encounter for screening for malignant neoplasm of cervix: Secondary | ICD-10-CM

## 2018-01-08 DIAGNOSIS — Z01419 Encounter for gynecological examination (general) (routine) without abnormal findings: Secondary | ICD-10-CM | POA: Diagnosis not present

## 2018-01-08 DIAGNOSIS — E669 Obesity, unspecified: Secondary | ICD-10-CM | POA: Diagnosis not present

## 2018-01-08 DIAGNOSIS — Z0000 Encounter for general adult medical examination without abnormal findings: Secondary | ICD-10-CM

## 2018-01-08 DIAGNOSIS — Z9289 Personal history of other medical treatment: Secondary | ICD-10-CM

## 2018-01-08 MED ORDER — PHENTERMINE HCL 37.5 MG PO TABS: 37.5000 mg | ORAL_TABLET | Freq: Every day | ORAL | 0 refills | Status: DC

## 2018-01-08 NOTE — Patient Instructions (Addendum)
PAP every three years Mammogram every year    Call [phone] to schedule at [facility] Colonoscopy every 10 years after age 49 Labs yearly (with PCP)  Phentermine tablets or capsules What is this medicine? PHENTERMINE (FEN ter meen) decreases your appetite. It is used with a reduced calorie diet and exercise to help you lose weight. This medicine may be used for other purposes; ask your health care provider or pharmacist if you have questions. COMMON BRAND NAME(S): Adipex-P, Atti-Plex P, Atti-Plex P Spansule, Fastin, Lomaira, Pro-Fast, Tara-8 What should I tell my health care provider before I take this medicine? They need to know if you have any of these conditions: -agitation -glaucoma -heart disease -high blood pressure -history of substance abuse -lung disease called Primary Pulmonary Hypertension (PPH) -taken an MAOI like Carbex, Eldepryl, Marplan, Nardil, or Parnate in last 14 days -thyroid disease -an unusual or allergic reaction to phentermine, other medicines, foods, dyes, or preservatives -pregnant or trying to get pregnant -breast-feeding How should I use this medicine? Take this medicine by mouth with a glass of water. Follow the directions on the prescription label. The instructions for use may differ based on the product and dose you are taking. Avoid taking this medicine in the evening. It may interfere with sleep. Take your doses at regular intervals. Do not take your medicine more often than directed. Talk to your pediatrician regarding the use of this medicine in children. While this drug may be prescribed for children 17 years or older for selected conditions, precautions do apply. Overdosage: If you think you have taken too much of this medicine contact a poison control center or emergency room at once. NOTE: This medicine is only for you. Do not share this medicine with others. What if I miss a dose? If you miss a dose, take it as soon as you can. If it is almost  time for your next dose, take only that dose. Do not take double or extra doses. What may interact with this medicine? Do not take this medicine with any of the following medications: -duloxetine -MAOIs like Carbex, Eldepryl, Marplan, Nardil, and Parnate -medicines for colds or breathing difficulties like pseudoephedrine or phenylephrine -procarbazine -sibutramine -SSRIs like citalopram, escitalopram, fluoxetine, fluvoxamine, paroxetine, and sertraline -stimulants like dexmethylphenidate, methylphenidate or modafinil -venlafaxine This medicine may also interact with the following medications: -medicines for diabetes This list may not describe all possible interactions. Give your health care provider a list of all the medicines, herbs, non-prescription drugs, or dietary supplements you use. Also tell them if you smoke, drink alcohol, or use illegal drugs. Some items may interact with your medicine. What should I watch for while using this medicine? Notify your physician immediately if you become short of breath while doing your normal activities. Do not take this medicine within 6 hours of bedtime. It can keep you from getting to sleep. Avoid drinks that contain caffeine and try to stick to a regular bedtime every night. This medicine was intended to be used in addition to a healthy diet and exercise. The best results are achieved this way. This medicine is only indicated for short-term use. Eventually your weight loss may level out. At that point, the drug will only help you maintain your new weight. Do not increase or in any way change your dose without consulting your doctor. You may get drowsy or dizzy. Do not drive, use machinery, or do anything that needs mental alertness until you know how this medicine affects you. Do not stand  or sit up quickly, especially if you are an older patient. This reduces the risk of dizzy or fainting spells. Alcohol may increase dizziness and drowsiness. Avoid  alcoholic drinks. What side effects may I notice from receiving this medicine? Side effects that you should report to your doctor or health care professional as soon as possible: -chest pain, palpitations -depression or severe changes in mood -increased blood pressure -irritability -nervousness or restlessness -severe dizziness -shortness of breath -problems urinating -unusual swelling of the legs -vomiting Side effects that usually do not require medical attention (report to your doctor or health care professional if they continue or are bothersome): -blurred vision or other eye problems -changes in sexual ability or desire -constipation or diarrhea -difficulty sleeping -dry mouth or unpleasant taste -headache -nausea This list may not describe all possible side effects. Call your doctor for medical advice about side effects. You may report side effects to FDA at 1-800-FDA-1088. Where should I keep my medicine? Keep out of the reach of children. This medicine can be abused. Keep your medicine in a safe place to protect it from theft. Do not share this medicine with anyone. Selling or giving away this medicine is dangerous and against the law. This medicine may cause accidental overdose and death if taken by other adults, children, or pets. Mix any unused medicine with a substance like cat litter or coffee grounds. Then throw the medicine away in a sealed container like a sealed bag or a coffee can with a lid. Do not use the medicine after the expiration date. Store at room temperature between 20 and 25 degrees C (68 and 77 degrees F). Keep container tightly closed. NOTE: This sheet is a summary. It may not cover all possible information. If you have questions about this medicine, talk to your doctor, pharmacist, or health care provider.  2018 Elsevier/Gold Standard (2015-05-13 12:53:15)

## 2018-01-08 NOTE — Progress Notes (Signed)
HPI:      Theresa Fox is a 47 y.o. G2P1011 who LMP was Patient's last menstrual period was 12/12/2017.,she presents today for her annual examination. The patient reports more hot flashes, mood changes, night sweats and period changes over the last 3 mos.. The patient is sexually active. Her last pap: approximate date 2014 and was normal and last mammogram: was normal. The patient does perform self breast exams.  There is no notable family history of breast or ovarian cancer in her family.  The patient has regular exercise: yes.  The patient denies current symptoms of depression.    GYN History: Contraception: none (h/o infertility)  PMHx: Past Medical History:  Diagnosis Date  . Hypertension   . Supraventricular tachycardia, nonsustained [facility])    Past Surgical History:  Procedure Laterality Date  . CESAREAN SECTION CLASSICAL     Family History  Problem Relation Age of Onset  . Hypertension Mother   . Hypertension Father   . Coronary artery disease Father   . Breast cancer Neg Hx    Social History   Tobacco Use  . Smoking status: Never Smoker  . Smokeless tobacco: Never Used  Substance Use Topics  . Alcohol use: Yes    Alcohol/week: 3.5 oz    Types: 7 Standard drinks or equivalent per week  . Drug use: No    Current Outpatient Medications:  .  ALPRAZolam (XANAX) 0.25 MG tablet, Take 1 tablet (0.25 mg total) by mouth 2 (two) times daily as needed for anxiety., Disp: 60 tablet, Rfl: 4 .  BYSTOLIC 5 MG tablet, TAKE 1 TABLET BY MOUTH  DAILY, Disp: 90 tablet, Rfl: 1 .  Coenzyme Q10 (CO Q 10 PO), Take 1 each by mouth daily., Disp: , Rfl:  .  cyanocobalamin (,VITAMIN B-12,) 1000 MCG/ML injection, Inject 1 mL (1,000 mcg total) into the muscle once a week., Disp: 10 mL, Rfl: 1 .  CYANOCOBALAMIN IJ, Inject as directed every 30 (thirty) days.  , Disp: , Rfl:  .  Multiple Vitamin (MULTI-VITAMINS) TABS, Take 1 tablet by mouth daily., Disp: , Rfl:  .  spironolactone (ALDACTONE)  25 MG tablet, TAKE 1 TABLET BY MOUTH  DAILY AS NEEDED FOR FLUID  RETENTION, Disp: 90 tablet, Rfl: 1 .  mometasone (NASONEX) 50 MCG/ACT nasal spray, Place 2 sprays into the nose daily., Disp: 17 g, Rfl: 12 Allergies: Patient has no known allergies.  Review of Systems  Constitutional: Negative for chills, fever and malaise/fatigue.  HENT: Negative for congestion, sinus pain and sore throat.   Eyes: Negative for blurred vision and pain.  Respiratory: Negative for cough and wheezing.   Cardiovascular: Negative for chest pain and leg swelling.  Gastrointestinal: Negative for abdominal pain, constipation, diarrhea, heartburn, nausea and vomiting.  Genitourinary: Negative for dysuria, frequency, hematuria and urgency.  Musculoskeletal: Negative for back pain, joint pain, myalgias and neck pain.  Skin: Negative for itching and rash.  Neurological: Negative for dizziness, tremors and weakness.  Endo/Heme/Allergies: Does not bruise/bleed easily.  Psychiatric/Behavioral: Negative for depression. The patient is not nervous/anxious and does not have insomnia.     Objective: BP 120/80   Ht 5\' 2"  (1.575 m)   Wt 200 lb (90.7 kg)   LMP 12/12/2017 Comment: Pt states not preg  BMI 36.58 kg/m   Filed Weights   01/08/18 1521  Weight: 200 lb (90.7 kg)   Body mass index is 36.58 kg/m. Physical Exam  Constitutional: She is oriented to person, place, and time. She  appears well-developed and well-nourished. No distress.  Genitourinary: Rectum normal, vagina normal and uterus normal. Pelvic exam was performed with patient supine. There is no rash or lesion on the right labia. There is no rash or lesion on the left labia. Vagina exhibits no lesion. No bleeding in the vagina. Right adnexum does not display mass and does not display tenderness. Left adnexum does not display mass and does not display tenderness. Cervix does not exhibit motion tenderness, lesion, friability or polyp.   Uterus is mobile and  midaxial. Uterus is not enlarged or exhibiting a mass.  HENT:  Head: Normocephalic and atraumatic. Head is without laceration.  Right Ear: Hearing normal.  Left Ear: Hearing normal.  Nose: No epistaxis.  No foreign bodies.  Mouth/Throat: Uvula is midline, oropharynx is clear and moist and mucous membranes are normal.  Eyes: Pupils are equal, round, and reactive to light.  Neck: Normal range of motion. Neck supple. No thyromegaly present.  Cardiovascular: Normal rate and regular rhythm. Exam reveals no gallop and no friction rub.  No murmur heard. Pulmonary/Chest: Effort normal and breath sounds normal. No respiratory distress. She has no wheezes. Right breast exhibits no mass, no skin change and no tenderness. Left breast exhibits no mass, no skin change and no tenderness.  Abdominal: Soft. Bowel sounds are normal. She exhibits no distension. There is no tenderness. There is no rebound.  Musculoskeletal: Normal range of motion.  Neurological: She is alert and oriented to person, place, and time. No cranial nerve deficit.  Skin: Skin is warm and dry.  Psychiatric: She has a normal mood and affect. Judgment normal.  Vitals reviewed.  Assessment:  ANNUAL EXAM 1. Annual physical exam   2. Screening for cervical cancer   3. Obesity (BMI 35.0-39.9 without comorbidity)   4.      Menopause  Screening Plan:            1.  Cervical Screening-  Pap smear done today  2. Breast screening- Exam annually and mammogram>40 planned   3. Colonoscopy every 10 years, Hemoccult testing - after age 75  4. Labs managed by PCP  5. Counseling for contraception: no method   6. Patient with bothersome menopausal vasomotor symptoms. Discussed lifestyle interventions such as wearing light clothing, remaining in cool environments, having fan/air conditioner in the room, avoiding hot beverages etc.  Exercise also shown to be significantly helpful in alleviating hot flashes.  Discussed using hormone therapy and  concerns about increased risk of heart disease, cerebrovascular disease, thromboembolic disease,  and breast cancer.  Also discussed other medical options such as Clonidine, SSRI, or Neurontin.   Also discussed alternative therapies such as herbal remedies but cautioned that most of the products contained phytoestrogens (plant estrogens) in unregulated amounts which can have the same effects on the body as the pharmaceutical estrogen preparations.  Patient opted for OTC therapy for now. She will return PRN for re-evaluation.  7.  Obesity (BMI 35.0-39.9 without comorbidity) - Phentermine discussed, pros and cons     F/U  Return in about 1 year (around 01/09/2019) for Annual.  Barnett Applebaum, MD, [facility], [facility] 01/08/2018  3:30 PM

## 2018-01-11 LAB — IGP, APTIMA HPV
HPV APTIMA: NEGATIVE
PAP SMEAR COMMENT: 0

## 2018-02-06 ENCOUNTER — Other Ambulatory Visit: Payer: Self-pay | Admitting: Obstetrics & Gynecology

## 2018-02-06 ENCOUNTER — Ambulatory Visit (INDEPENDENT_AMBULATORY_CARE_PROVIDER_SITE_OTHER): Payer: 59 | Admitting: Obstetrics & Gynecology

## 2018-02-06 ENCOUNTER — Encounter: Payer: Self-pay | Admitting: Obstetrics & Gynecology

## 2018-02-06 VITALS — BP 120/80 | Ht 62.0 in | Wt 196.0 lb

## 2018-02-06 DIAGNOSIS — E669 Obesity, unspecified: Secondary | ICD-10-CM

## 2018-02-06 MED ORDER — PHENTERMINE HCL 37.5 MG PO TABS: 37.5000 mg | ORAL_TABLET | Freq: Every day | ORAL | 1 refills | Status: DC

## 2018-02-06 NOTE — Progress Notes (Signed)
  History of Present Illness:  Theresa Fox is a 47 y.o. who was started on  Phentermine approximately 4 weeks ago. Since that time, she states that her symptoms are improving.  PMHx: She  has a past medical history of Hypertension and Supraventricular tachycardia, nonsustained ([place]). Also,  has a past surgical history that includes Cesarean section classical., family history includes Coronary artery disease in her father; Hypertension in her father and mother.,  reports that she has never smoked. She has never used smokeless tobacco. She reports that she drinks about 4.2 oz of alcohol per week. She reports that she does not use drugs. No outpatient medications have been marked as taking for the 02/06/18 encounter (Office Visit) with Gae Dry, MD.  . Also, has No Known Allergies..  Review of Systems  All other systems reviewed and are negative.   Physical Exam:  BP 120/80   Ht 5\' 2"  (1.575 m)   Wt 196 lb (88.9 kg)   LMP 02/01/2018   BMI 35.85 kg/m  Body mass index is 35.85 kg/m. Constitutional: Well nourished, well developed female in no acute distress.  Abdomen: diffusely non tender to palpation, non distended, and no masses, hernias Neuro: Grossly intact Psych:  Normal mood and affect.    Assessment:  Problem List Items Addressed This Visit      Other   Obesity (BMI 30-39.9) - Primary   Relevant Medications   phentermine (ADIPEX-P) 37.5 MG tablet     Medication treatment is going very well for her weight loss secondary to obesity.  Plan: She will undergo no change in her medical therapy.  She was amenable to this plan and we will see her back for annual/PRN.  A total of 15 minutes were spent face-to-face with the patient during this encounter and over half of that time dealt with counseling and coordination of care.  Barnett Applebaum, MD, [facility], [facility] 02/06/2018  3:50 PM

## 2018-02-10 ENCOUNTER — Other Ambulatory Visit: Payer: Self-pay

## 2018-02-10 ENCOUNTER — Encounter: Payer: Self-pay | Admitting: Obstetrics & Gynecology

## 2018-02-10 MED ORDER — PHENTERMINE HCL 37.5 MG PO TABS: 37.5000 mg | ORAL_TABLET | Freq: Every day | ORAL | 1 refills | Status: DC

## 2018-03-15 IMAGING — DX DG CHEST 2V
2 series · 2 of 2 positions shown · non-contrast
Comparison: None.

CLINICAL DATA: Supraventricular tachycardia

EXAM:
CHEST  2 VIEW

[chest pa]
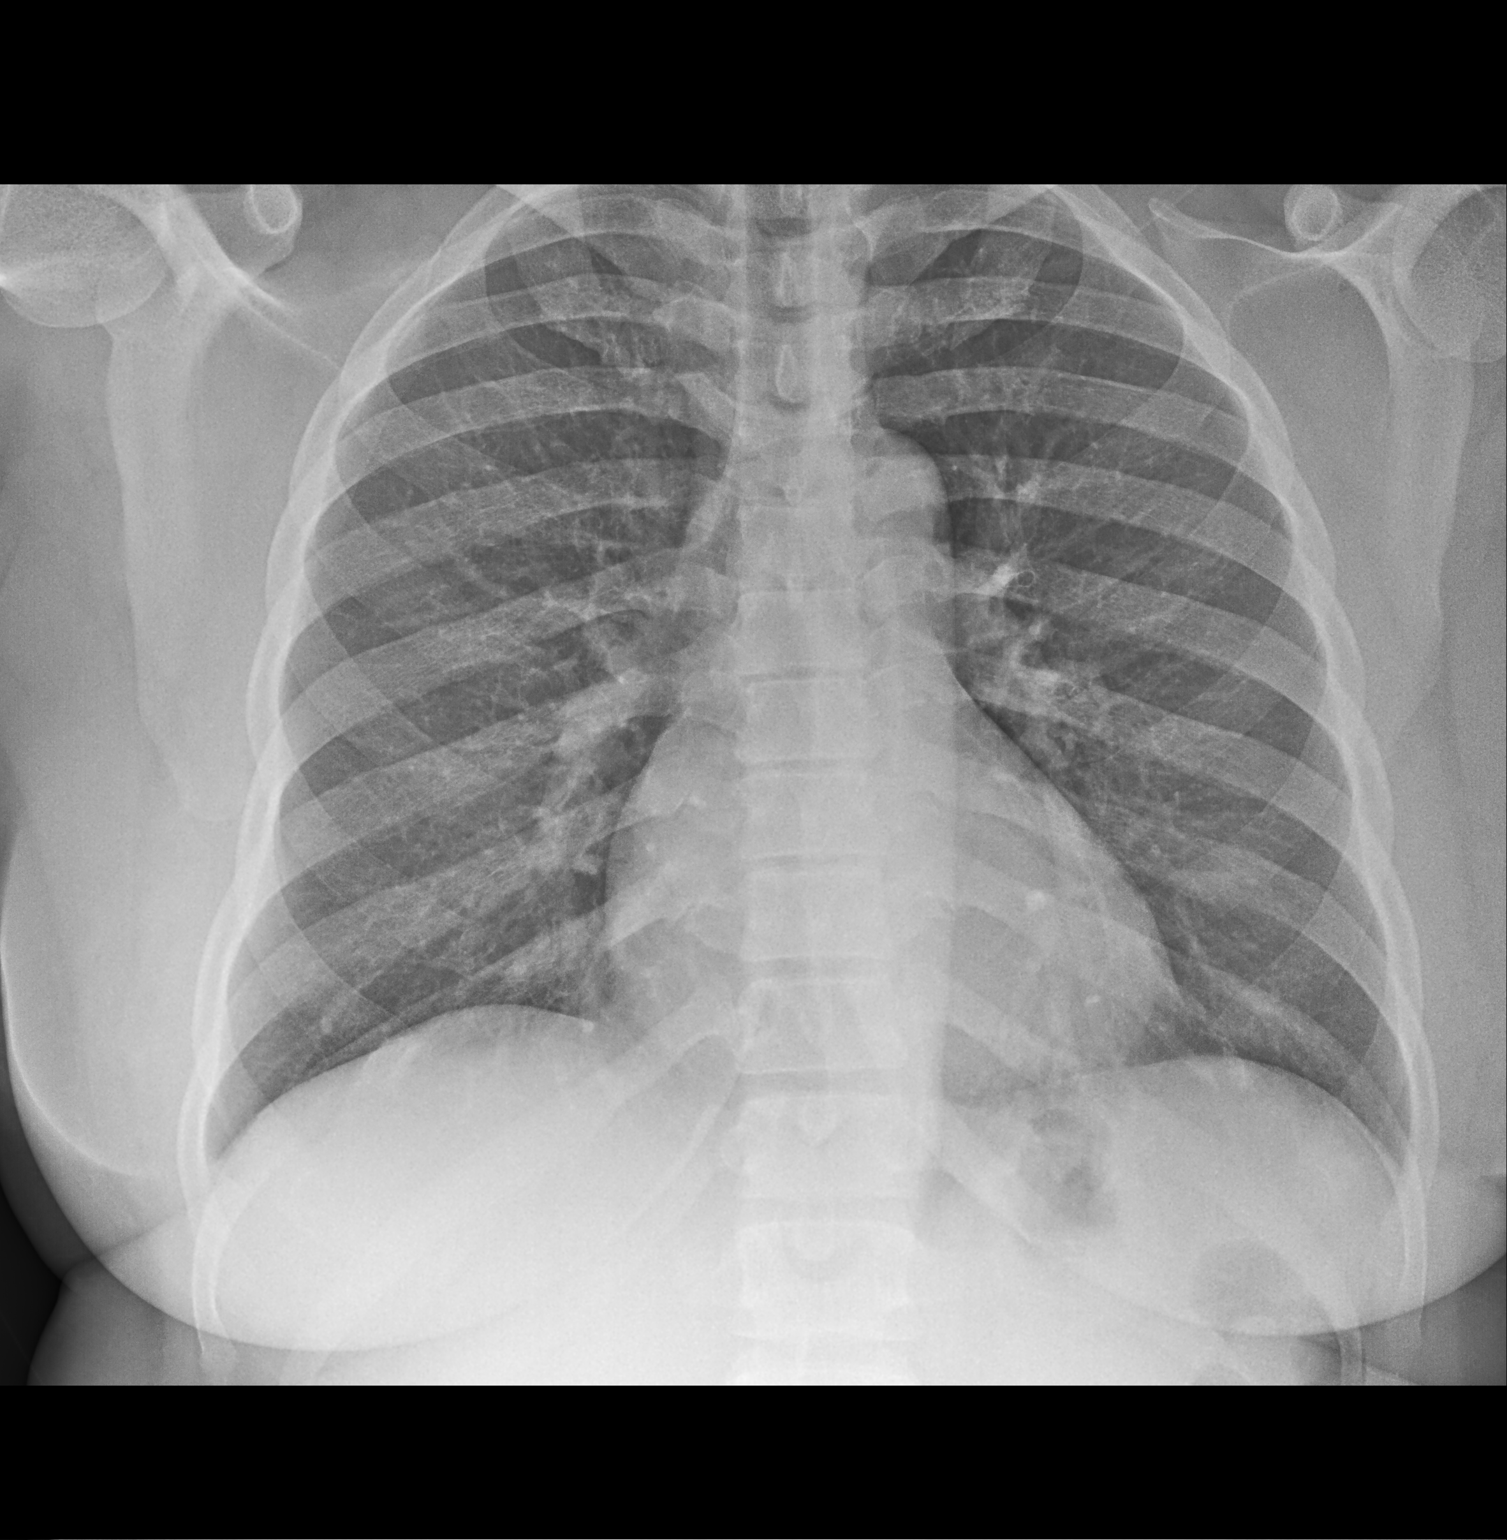

[chest lat]
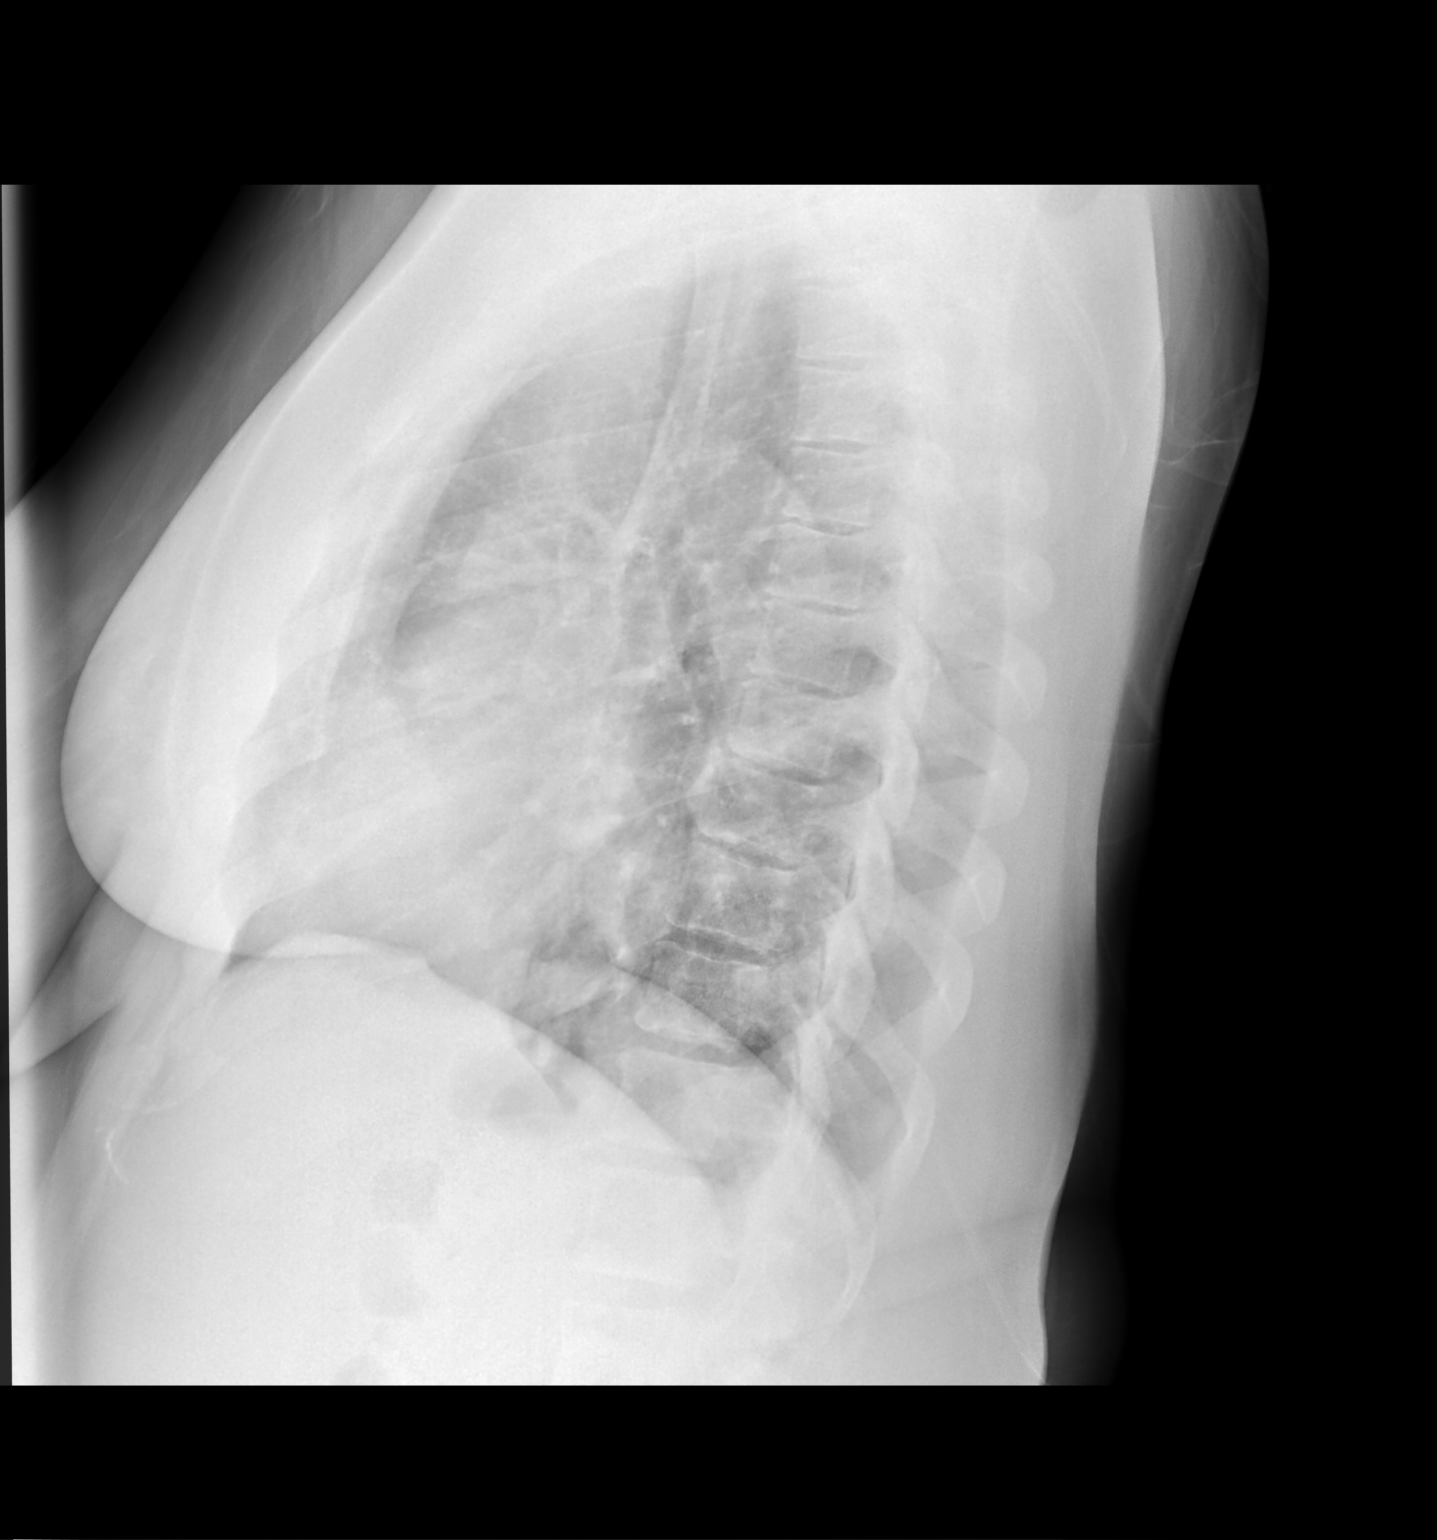

[2 of 2 positions shown; findings below may reference images not displayed]

FINDINGS: Lungs are clear. Heart size and pulmonary vascularity are normal. No
adenopathy. No pneumothorax. No bone lesions.
IMPRESSION: No edema or consolidation.

## 2018-04-11 ENCOUNTER — Encounter: Payer: Self-pay | Admitting: Obstetrics & Gynecology

## 2018-04-11 ENCOUNTER — Other Ambulatory Visit: Payer: Self-pay | Admitting: Obstetrics & Gynecology

## 2018-04-11 ENCOUNTER — Ambulatory Visit (INDEPENDENT_AMBULATORY_CARE_PROVIDER_SITE_OTHER): Payer: 59 | Admitting: Obstetrics & Gynecology

## 2018-04-11 VITALS — BP 122/82 | Ht 62.0 in | Wt 194.0 lb

## 2018-04-11 DIAGNOSIS — Z6835 Body mass index (BMI) 35.0-35.9, adult: Secondary | ICD-10-CM

## 2018-04-11 DIAGNOSIS — E669 Obesity, unspecified: Secondary | ICD-10-CM

## 2018-04-11 MED ORDER — DIETHYLPROPION HCL 25 MG PO TABS: 1.0000 | ORAL_TABLET | Freq: Every day | ORAL | 0 refills | Status: DC

## 2018-04-11 NOTE — Patient Instructions (Signed)
Diethylpropion tablets What is this medicine? DIETHYLPROPION (dye eth il PROE pee on) decreases your appetite. It is used with a reduced calorie diet and exercise to help you lose weight. This medicine is only meant to be used for a few weeks. This medicine may be used for other purposes; ask your health care provider or pharmacist if you have questions. COMMON BRAND NAME(S): Depletite # 2, Radtue, Tenuate What should I tell my health care provider before I take this medicine? They need to know if you have any of these conditions: -agitation -glaucoma -high blood pressure -history of drug dependence or substance abuse -hyperthyroid -kidney or liver disease -lung disease -valvular heart disease -an unusual or allergic reaction to diethylpropion, other medicines, foods, dyes, or preservatives -pregnant or trying to get pregnant -breast-feeding How should I use this medicine? Take this medicine by mouth with a glass of water. Follow the directions on the prescription label. Take this medicine 1 hour before meals. If a meal is missed, do not take that dose. An additional tablet may be taken in the mid evening if needed for night time hunger. Do not take your medicine more often than directed. Talk to your pediatrician regarding the use of this medicine in children. Special care may be needed. While this medicine may be prescribed for children as young as 16 years for selected conditions, precautions do apply. Overdosage: If you think you have taken too much of this medicine contact a poison control center or emergency room at once. NOTE: This medicine is only for you. Do not share this medicine with others. What if I miss a dose? If you miss a dose, take a dose as soon as you can with the next meal. If it is almost time for your next dose, take only that dose. Do not take double or extra doses. What may interact with this medicine? Do not take this medicine with any of the following  medications: -fluoxetine -MAOIs like Carbex, Eldepryl, Marplan, Nardil, and Parnate -medicines for colds or breathing difficulties like pseudoephedrine or phenylephrine -other medicines or herbal products for weight loss or to decrease appetite -procarbazine -sibutramine -stimulants like amphetamine, dextroamphetamine, dexmethylphenidate, methylphenidate or modafinil This medicine may also interact with the following medications: -general anesthetics -insulin and other medicines for diabetes -medicines for high blood pressure -phenothiazines like chlorpromazine, mesoridazine, prochlorperazine, thioridazine This list may not describe all possible interactions. Give your health care provider a list of all the medicines, herbs, non-prescription drugs, or dietary supplements you use. Also tell them if you smoke, drink alcohol, or use illegal drugs. Some items may interact with your medicine. What should I watch for while using this medicine? Visit your doctor or health care professional for regular checks on your progress. You need to closely monitor your weight loss. If your rate of weight loss slows down or stops, you may need to stop the medicine, and restart after a time without the medicine. You may get dizzy. Do not drive, use machinery, or do anything that needs mental alertness until you know how this medicine affects you. Do not stand or sit up quickly, especially if you are an older patient. This reduces the risk of dizzy or fainting spells. Alcohol may increase dizziness. Avoid alcoholic drinks. What side effects may I notice from receiving this medicine? Side effects that you should report to your doctor or health care professional as soon as possible: -allergic reactions like skin rash, itching or hives, swelling of the face, lips, or tongue -  angry, confusion, more nervous, restless -breast growth in men -breathing problems -changes in vision -chest pain -difficulty with  balance -fast, irregular heartbeat -hallucinations -nausea, vomiting -seizures -tremors -trouble sleeping Side effects that usually do not require medical attention (report to your doctor or health care professional if they continue or are bothersome): -constipation or diarrhea -dry mouth or unpleasant taste -flushing of the skin -hair loss -headache -increase or decrease in sexual desire or performance -menstrual irregularity -red or itchy skin -upset stomach This list may not describe all possible side effects. Call your doctor for medical advice about side effects. You may report side effects to FDA at 1-800-FDA-1088. Where should I keep my medicine? Keep out of the reach of children. This medicine can be abused. Keep your medicine in a safe place to protect it from theft. Do not share this medicine with anyone. Selling or giving away this medicine is dangerous and against the law. This medicine may cause accidental overdose and death if taken by other adults, children, or pets. Mix any unused medicine with a substance like cat litter or coffee grounds. Then throw the medicine away in a sealed container like a sealed bag or a coffee can with a lid. Do not use the medicine after the expiration date. Store at room temperature below 30 degrees C (86 degrees F). Protect from heat. Keep container tightly closed. NOTE: This sheet is a summary. It may not cover all possible information. If you have questions about this medicine, talk to your doctor, pharmacist, or health care provider.  2018 Elsevier/Gold Standard (2014-04-27 15:18:21)  

## 2018-04-11 NOTE — Progress Notes (Signed)
  History of Present Illness:  Theresa Fox is a 47 y.o. who was started on  . phentermine (ADIPEX-P) 37.5 MG tablet Take 1 tablet (37.5 mg total) by mouth daily before breakfast. 30 tablet 1  approximately 3 months ago due to obesity/abnormal weight gain. The patient has lost 2 pounds over the past 2 mos due to lifestyle changes (exercises 3-5x/week and diet) and Phentermine..   She has these side effects: none.  PMHx: She  has a past medical history of Hypertension and Supraventricular tachycardia, nonsustained ([place]). Also,  has a past surgical history that includes Cesarean section classical., family history includes Coronary artery disease in her father; Hypertension in her father and mother.,  reports that she has never smoked. She has never used smokeless tobacco. She reports that she drinks about 7.0 standard drinks of alcohol per week. She reports that she does not use drugs.  She has a current medication list which includes the following prescription(s): alprazolam, bystolic, coenzyme Z30, cyanocobalamin, cyanocobalamin, mometasone, multi-vitamins, phentermine, and spironolactone. Also, has No Known Allergies.  Review of Systems  All other systems reviewed and are negative.   Physical Exam:  There were no vitals taken for this visit. There is no height or weight on file to calculate BMI. There were no vitals filed for this visit.  Physical Exam  Constitutional: She is oriented to person, place, and time. She appears well-developed and well-nourished. No distress.  Musculoskeletal: Normal range of motion.  Neurological: She is alert and oriented to person, place, and time.  Skin: Skin is warm and dry.  Psychiatric: She has a normal mood and affect.  Vitals reviewed.  Assessment: obesity- BMI 35 Medication treatment is going marginally for her.  No weight gain but min weight loss over the last 2 mos.  Plan: Patient will be continued/added to prescription appetite suppressants:  Change to TENUATE, and self-directed dieting.  Consider Topamax as a supplemental medicine as well if these meds do not have maximal effect on their own.   Will continue to assist patient in incorporating positive experiences into her life to promote a positive mental attitude.  Education given regarding appropriate lifestyle changes for weight loss, including regular physical activity, healthy coping strategies, caloric restriction, and healthy eating patterns.  The risks and benefits as well as side effects of medication, such as Phenteramine or Tenuate, is discussed.  The pros and cons of suppressing appetite and boosting metabolism is counseled.  Risks of tolerance and addiction discussed.  Use of medicine will be short term.  Pt to call with any negative side effects and agrees to keep follow up appointments.  A total of 15 minutes were spent face-to-face with the patient during this encounter and over half of that time dealt with counseling and coordination of care.  Barnett Applebaum, MD, [facility], [facility] 04/11/2018  8:11 AM

## 2018-04-11 NOTE — Telephone Encounter (Signed)
Let patient know that I called [facility] and they carry it, but [facility] does not.  She can pick up at [facility] later today

## 2018-04-11 NOTE — Telephone Encounter (Signed)
Pt aware.

## 2018-05-16 ENCOUNTER — Encounter: Payer: Self-pay | Admitting: Obstetrics & Gynecology

## 2018-05-16 ENCOUNTER — Ambulatory Visit (INDEPENDENT_AMBULATORY_CARE_PROVIDER_SITE_OTHER): Payer: 59 | Admitting: Obstetrics & Gynecology

## 2018-05-16 VITALS — BP 130/80 | Ht 62.0 in | Wt 192.0 lb

## 2018-05-16 DIAGNOSIS — E669 Obesity, unspecified: Secondary | ICD-10-CM | POA: Diagnosis not present

## 2018-05-16 DIAGNOSIS — Z6835 Body mass index (BMI) 35.0-35.9, adult: Secondary | ICD-10-CM

## 2018-05-16 MED ORDER — NALTREXONE-BUPROPION HCL ER 8-90 MG PO TB12: ORAL_TABLET | ORAL | 0 refills | Status: DC

## 2018-05-16 MED ORDER — NALTREXONE-BUPROPION HCL ER 8-90 MG PO TB12: 2.0000 | ORAL_TABLET | Freq: Two times a day (BID) | ORAL | 10 refills | Status: DC

## 2018-05-16 NOTE — Progress Notes (Signed)
  History of Present Illness:  Theresa Fox is a 47 y.o. who was started on  Tenuate last month and Phentermine 3 mos prior to that due to obesity/abnormal weight gain. The patient has lost 2 pounds over the past Month due to Tenuate and other measures, as before still exercising regularly and follows a low cal diet..   She has these side effects: none.  PMHx: She  has a past medical history of Hypertension and Supraventricular tachycardia, nonsustained ([place]). Also,  has a past surgical history that includes Cesarean section classical., family history includes Coronary artery disease in her father; Hypertension in her father and mother.,  reports that she has never smoked. She has never used smokeless tobacco. She reports that she drinks about 7.0 standard drinks of alcohol per week. She reports that she does not use drugs.  She has a current medication list which includes the following prescription(s): alprazolam, bystolic, coenzyme S92, cyanocobalamin, cyanocobalamin, multi-vitamins, spironolactone, mometasone, naltrexone-bupropion hcl er, and naltrexone-bupropion hcl er. Also, has No Known Allergies.  Review of Systems  All other systems reviewed and are negative.  Physical Exam:  BP 130/80   Ht 5\' 2"  (1.575 m)   Wt 192 lb (87.1 kg)   LMP 04/26/2018   BMI 35.12 kg/m  Body mass index is 35.12 kg/m. Filed Weights   05/16/18 0833  Weight: 192 lb (87.1 kg)    Physical Exam  Constitutional: She is oriented to person, place, and time. She appears well-developed and well-nourished. No distress.  Musculoskeletal: Normal range of motion.  Neurological: She is alert and oriented to person, place, and time.  Skin: Skin is warm and dry.  Psychiatric: She has a normal mood and affect.  Vitals reviewed.  Assessment: obesity- BMI 35 Medication treatment is going adequately for her with no weight gain, but unable to lose much weight (4 lbs over 4 mos).  Plan: Patient was not continued/added  to Tenuate.  Will plan change to Contrave. Also counseled as to possibility for referral to a weight loss/ Bariatric center. Other medicine options of Belviq and Saxenda also discussed.  Pros and cons and side effects of Contrave discussed, and Rx today; taper up to full dose over next 4 weeks.  A total of 15 minutes were spent face-to-face with the patient during this encounter and over half of that time dealt with counseling and coordination of care.  Barnett Applebaum, MD, [facility], [facility] 05/16/2018  9:04 AM

## 2018-05-16 NOTE — Patient Instructions (Signed)
Bupropion; Naltrexone extended-release tablets What is this medicine? BUPROPION; NALTREXONE (byoo PROE pee on; nal TREX one) is a combination product used to promote and maintain weight loss in obese adults or overweight adults who also have weight related medical problems. This medicine should be used with a reduced calorie diet and increased physical activity. This medicine may be used for other purposes; ask your health care provider or pharmacist if you have questions. COMMON BRAND NAME(S): CONTRAVE What should I tell my health care provider before I take this medicine? They need to know if you have any of these conditions: -an eating disorder, such as anorexia or bulimia -bipolar disorder -diabetes -depression -drug abuse or addiction -glaucoma -head injury -heart disease -high blood pressure -history of a tumor or infection of your brain or spine -history of stroke -history of irregular heartbeat -if you often drink alcohol -kidney disease -liver disease -schizophrenia -seizures -suicidal thoughts, plans, or attempt; a previous suicide attempt by you or a family member -an unusual or allergic reaction to bupropion, naltrexone, other medicines, foods, dyes, or preservatives -breast-feeding -pregnant or trying to become pregnant How should I use this medicine? Take this medicine by mouth with a glass of water. Follow the directions on the prescription label. Take this medicine in the morning and in the evenings as directed by your healthcare professional. You can take it with or without food. Do not take with high-fat meals as this may increase your risk of seizures. Do not crush, chew, or cut these tablets. Do not take your medicine more often than directed. Do not stop taking this medicine suddenly except upon the advice of your doctor. A special MedGuide will be given to you by the pharmacist with each prescription and refill. Be sure to read this information carefully each  time. Talk to your pediatrician regarding the use of this medicine in children. Special care may be needed. Overdosage: If you think you have taken too much of this medicine contact a poison control center or emergency room at once. NOTE: This medicine is only for you. Do not share this medicine with others. What if I miss a dose? If you miss a dose, skip the missed dose and take your next tablet at the regular time. Do not take double or extra doses. What may interact with this medicine? Do not take this medicine with any of the following medications: -any prescription or street opioid drug like codeine, heroin, methadone -linezolid -MAOIs like Carbex, Eldepryl, Marplan, Nardil, and Parnate -methylene blue (injected into a vein) -other medicines that contain bupropion like Zyban or Wellbutrin This medicine may also interact with the following medications: -alcohol -certain medicines for anxiety or sleep -certain medicines for blood pressure like metoprolol, propranolol -certain medicines for depression or psychotic disturbances -certain medicines for HIV or AIDS like efavirenz, lopinavir, nelfinavir, ritonavir -certain medicines for irregular heart beat like propafenone, flecainide -certain medicines for Parkinson's disease like amantadine, levodopa -certain medicines for seizures like carbamazepine, phenytoin, phenobarbital -cimetidine -clopidogrel -cyclophosphamide -digoxin -disulfiram -furazolidone -isoniazid -nicotine -orphenadrine -procarbazine -steroid medicines like prednisone or cortisone -stimulant medicines for attention disorders, weight loss, or to stay awake -tamoxifen -theophylline -thioridazine -thiotepa -ticlopidine -tramadol -warfarin This list may not describe all possible interactions. Give your health care provider a list of all the medicines, herbs, non-prescription drugs, or dietary supplements you use. Also tell them if you smoke, drink alcohol, or use  illegal drugs. Some items may interact with your medicine. What should I watch for while using this   medicine? This medicine is intended to be used in addition to a healthy diet and appropriate exercise. The best results are achieved this way. Do not increase or in any way change your dose without consulting your doctor or health care professional. Do not take this medicine with other prescription or over-the-counter weight loss products without consulting your doctor or health care professional. Your doctor should tell you to stop taking this medicine if you do not lose a certain amount of weight within the first 12 weeks of treatment. Visit your doctor or health care professional for regular checkups. Your doctor may order blood tests or other tests to see how you are doing. This medicine may affect blood sugar levels. If you have diabetes, check with your doctor or health care professional before you change your diet or the dose of your diabetic medicine. Patients and their families should watch out for new or worsening depression or thoughts of suicide. Also watch out for sudden changes in feelings such as feeling anxious, agitated, panicky, irritable, hostile, aggressive, impulsive, severely restless, overly excited and hyperactive, or not being able to sleep. If this happens, especially at the beginning of treatment or after a change in dose, call your health care professional. Avoid alcoholic drinks while taking this medicine. Drinking large amounts of alcoholic beverages, using sleeping or anxiety medicines, or quickly stopping the use of these agents while taking this medicine may increase your risk for a seizure. What side effects may I notice from receiving this medicine? Side effects that you should report to your doctor or health care professional as soon as possible: -allergic reactions like skin rash, itching or hives, swelling of the face, lips, or tongue -breathing problems -changes in  vision -confusion -elevated mood, decreased need for sleep, racing thoughts, impulsive behavior -fast or irregular heartbeat -hallucinations, loss of contact with reality -increased blood pressure -redness, blistering, peeling or loosening of the skin, including inside the mouth -seizures -signs and symptoms of liver injury like dark yellow or brown urine; general ill feeling or flu-like symptoms; light-colored stools; loss of appetite; nausea; right upper belly pain; unusually weak or tired; yellowing of the eyes or skin -suicidal thoughts or other mood changes -vomiting Side effects that usually do not require medical attention (report to your doctor or health care professional if they continue or are bothersome): -constipation -headache -loss of appetite -indigestion, stomach upset -tremors This list may not describe all possible side effects. Call your doctor for medical advice about side effects. You may report side effects to FDA at 1-800-FDA-1088. Where should I keep my medicine? Keep out of the reach of children. Store at room temperature between 15 and 30 degrees C (59 and 86 degrees F). Throw away any unused medicine after the expiration date. NOTE: This sheet is a summary. It may not cover all possible information. If you have questions about this medicine, talk to your doctor, pharmacist, or health care provider.  2018 Elsevier/Gold Standard (2016-01-27 13:42:58)  

## 2018-05-19 ENCOUNTER — Other Ambulatory Visit: Payer: Self-pay | Admitting: Obstetrics & Gynecology

## 2018-05-19 MED ORDER — PHENTERMINE HCL 37.5 MG PO TABS: 37.5000 mg | ORAL_TABLET | Freq: Every day | ORAL | 1 refills | Status: DC

## 2018-07-16 ENCOUNTER — Ambulatory Visit: Payer: 59 | Admitting: Obstetrics & Gynecology

## 2018-09-24 ENCOUNTER — Encounter: Payer: Self-pay | Admitting: Podiatry

## 2018-09-24 ENCOUNTER — Ambulatory Visit (INDEPENDENT_AMBULATORY_CARE_PROVIDER_SITE_OTHER): Payer: 59 | Admitting: Podiatry

## 2018-09-24 ENCOUNTER — Ambulatory Visit (INDEPENDENT_AMBULATORY_CARE_PROVIDER_SITE_OTHER): Payer: 59

## 2018-09-24 DIAGNOSIS — M7751 Other enthesopathy of right foot: Secondary | ICD-10-CM

## 2018-09-24 DIAGNOSIS — M779 Enthesopathy, unspecified: Secondary | ICD-10-CM

## 2018-09-24 DIAGNOSIS — M778 Other enthesopathies, not elsewhere classified: Secondary | ICD-10-CM
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
## 2018-10-01 ENCOUNTER — Telehealth: Payer: Self-pay | Admitting: Gastroenterology

## 2018-10-01 ENCOUNTER — Ambulatory Visit (INDEPENDENT_AMBULATORY_CARE_PROVIDER_SITE_OTHER): Payer: 59 | Admitting: Internal Medicine

## 2018-10-01 ENCOUNTER — Encounter: Payer: Self-pay | Admitting: Internal Medicine

## 2018-10-01 VITALS — BP 104/76 | HR 73 | Temp 98.3°F | Resp 15 | Ht 62.0 in | Wt 196.0 lb

## 2018-10-01 DIAGNOSIS — Z23 Encounter for immunization: Secondary | ICD-10-CM | POA: Diagnosis not present

## 2018-10-01 DIAGNOSIS — Z1211 Encounter for screening for malignant neoplasm of colon: Secondary | ICD-10-CM

## 2018-10-01 DIAGNOSIS — E669 Obesity, unspecified: Secondary | ICD-10-CM

## 2018-10-01 DIAGNOSIS — D509 Iron deficiency anemia, unspecified: Secondary | ICD-10-CM

## 2018-10-01 DIAGNOSIS — Z0001 Encounter for general adult medical examination with abnormal findings: Secondary | ICD-10-CM | POA: Diagnosis not present

## 2018-10-01 DIAGNOSIS — N6320 Unspecified lump in the left breast, unspecified quadrant: Secondary | ICD-10-CM

## 2018-10-01 DIAGNOSIS — R5383 Other fatigue: Secondary | ICD-10-CM

## 2018-10-01 LAB — COMPREHENSIVE METABOLIC PANEL
ALK PHOS: 82 U/L (ref 39–117)
ALT: 15 U/L (ref 0–35)
AST: 15 U/L (ref 0–37)
Albumin: 4.2 g/dL (ref 3.5–5.2)
BUN: 8 mg/dL (ref 6–23)
CHLORIDE: 102 meq/L (ref 96–112)
CO2: 27 mEq/L (ref 19–32)
Calcium: 9.4 mg/dL (ref 8.4–10.5)
Creatinine, Ser: 1.02 mg/dL (ref 0.40–1.20)
GFR: 69.54 mL/min (ref >60.00)
GLUCOSE: 97 mg/dL (ref 70–99)
POTASSIUM: 4 meq/L (ref 3.5–5.1)
SODIUM: 138 meq/L (ref 135–145)
TOTAL PROTEIN: 7.1 g/dL (ref 6.0–8.3)
Total Bilirubin: 0.6 mg/dL (ref 0.2–1.2)

## 2018-10-01 LAB — CBC WITH DIFFERENTIAL/PLATELET
BASOS ABS: 0 10*3/uL (ref 0.0–0.1)
Basophils Relative: 0.3 % (ref 0.0–3.0)
Eosinophils Absolute: 0.1 10*3/uL (ref 0.0–0.7)
Eosinophils Relative: 1.8 % (ref 0.0–5.0)
HCT: 39.2 % (ref 36.0–46.0)
Hemoglobin: 13.1 g/dL (ref 12.0–15.0)
LYMPHS ABS: 2.5 10*3/uL (ref 0.7–4.0)
Lymphocytes Relative: 30.1 % (ref 12.0–46.0)
MCHC: 33.5 g/dL (ref 30.0–36.0)
MCV: 83.5 fl (ref 78.0–100.0)
MONO ABS: 0.6 10*3/uL (ref 0.1–1.0)
Monocytes Relative: 7.5 % (ref 3.0–12.0)
NEUTROS ABS: 4.9 10*3/uL (ref 1.4–7.7)
NEUTROS PCT: 60.3 % (ref 43.0–77.0)
PLATELETS: 407 10*3/uL — AB (ref 150.0–400.0)
RBC: 4.7 Mil/uL (ref 3.87–5.11)
RDW: 14.8 % (ref 11.5–15.5)
WBC: 8.2 10*3/uL (ref 4.0–10.5)

## 2018-10-01 LAB — LIPID PANEL
CHOLESTEROL: 178 mg/dL (ref 0–200)
HDL: 48.8 mg/dL (ref >39.00)
LDL Cholesterol: 102 mg/dL — ABNORMAL HIGH (ref 0–99)
NONHDL: 129.28
TRIGLYCERIDES: 136 mg/dL (ref 0.0–149.0)
Total CHOL/HDL Ratio: 4
VLDL: 27.2 mg/dL (ref 0.0–40.0)

## 2018-10-01 LAB — HEMOGLOBIN A1C: Hgb A1c MFr Bld: 5.6 % (ref 4.6–6.5)

## 2018-10-01 LAB — TSH: TSH: 1.62 u[IU]/mL (ref 0.35–4.50)

## 2018-10-01 NOTE — Telephone Encounter (Signed)
Patient is returning call from [facility] to schedule a colonoscopy

## 2018-10-01 NOTE — Telephone Encounter (Signed)
Returned patients call discussed colonoscopy.  She has been triaged.  Plans on checking with her insurance to see if her colonoscopy will be covered before the age of 13 or if she needs to wait.  Thanks [employer]

## 2018-10-01 NOTE — Progress Notes (Signed)
Patient ID: Theresa Fox, female    DOB: 1970/09/01  Age: 48 y.o. MRN: [id number]  The patient is here for follow up [place] The risk factors are reflected in the social history.  The roster of all physicians providing medical care to patient - is listed in the Snapshot section of the chart.  Activities of daily living:  The patient is 100% independent in all ADLs: dressing, toileting, feeding as well as independent mobility  Home safety : The patient has smoke detectors in the home. They wear seatbelts.  There are no firearms at home. There is no violence in the home.   There is no risks for hepatitis, STDs or HIV. There is no   history of blood transfusion. They have no travel history to infectious disease endemic areas of the world.  The patient has seen their dentist in the last six month. She has seen her eye doctor in the last year. She denies hearing difficulty with regard to whispered voices and some television programs.  They have deferred audiologic testing in the last year.  They do not  have excessive sun exposure. Discussed the need for sun protection: hats, long sleeves and use of sunscreen if there is significant sun exposure.   Diet: the importance of a healthy diet is discussed. They do have a healthy diet.  The benefits of regular aerobic exercise were discussed. She walks 4 times per week ,  20 minutes.   Depression screen: there are no signs or vegative symptoms of depression- irritability, change in appetite, anhedonia, sadness/tearfullness.  The following portions of the patient's history were reviewed and updated as appropriate: allergies, current medications, past family history, past medical history,  past surgical history, past social history  and problem list.  Visual acuity was not assessed per patient preference since she has regular follow up with her ophthalmologist. Hearing and body mass index were assessed and reviewed.   During the course of the visit the  patient was educated and counseled about appropriate screening and preventive services including :, diabetes screening, nutrition counseling, colorectal cancer screening, and recommended immunizations.    CC: The primary encounter diagnosis was Screening for colon cancer. Diagnoses of Need for immunization against influenza, Obesity (BMI 30.0-34.9), Fatigue, unspecified type, Left breast mass, Encounter for routine adult health examination with abnormal findings, Microcytic anemia, and Obesity (BMI 30-39.9) were also pertinent to this visit.  History Theresa Fox has a past medical history of Hypertension and Supraventricular tachycardia, nonsustained ([place]).   She has a past surgical history that includes Cesarean section classical.   Her family history includes Coronary artery disease in her father; Hypertension in her father and mother.She reports that she has never smoked. She has never used smokeless tobacco. She reports current alcohol use of about 7.0 standard drinks of alcohol per week. She reports that she does not use drugs.  Outpatient Medications Prior to Visit  Medication Sig Dispense Refill  . BYSTOLIC 5 MG tablet TAKE 1 TABLET BY MOUTH  DAILY 90 tablet 1  . Multiple Vitamin (MULTI-VITAMINS) TABS Take 1 tablet by mouth daily.    . Nutritional Supplements (DHEA PO) Take by mouth.    . spironolactone (ALDACTONE) 25 MG tablet TAKE 1 TABLET BY MOUTH  DAILY AS NEEDED FOR FLUID  RETENTION 90 tablet 1  . Coenzyme Q10 (CO Q 10 PO) Take 1 each by mouth daily.    . cyanocobalamin (,VITAMIN B-12,) 1000 MCG/ML injection Inject 1 mL (1,000 mcg total) into the muscle  once a week. (Patient not taking: Reported on 10/01/2018) 10 mL 1   No facility-administered medications prior to visit.     Review of Systems   Patient denies headache, fevers, malaise, unintentional weight loss, skin rash, eye pain, sinus congestion and sinus pain, sore throat, dysphagia,  hemoptysis , cough, dyspnea, wheezing, chest  pain, palpitations, orthopnea, edema, abdominal pain, nausea, melena, diarrhea, constipation, flank pain, dysuria, hematuria, urinary  Frequency, nocturia, numbness, tingling, seizures,  Focal weakness, Loss of consciousness,  Tremor, insomnia, depression, anxiety, and suicidal ideation.      Objective:  BP 104/76 (BP Location: Left Arm, Patient Position: Sitting, Cuff Size: Large)   Pulse 73   Temp 98.3 F (36.8 C) (Oral)   Resp 15   Ht 5\' 2"  (1.575 m)   Wt 196 lb (88.9 kg)   SpO2 98%   BMI 35.85 kg/m   Physical Exam   General appearance: alert, cooperative and appears stated age Head: Normocephalic, without obvious abnormality, atraumatic Eyes: conjunctivae/corneas clear. PERRL, EOM's intact. Fundi benign. Ears: normal TM's and external ear canals both ears Nose: Nares normal. Septum midline. Mucosa normal. No drainage or sinus tenderness. Throat: lips, mucosa, and tongue normal; teeth and gums normal Neck: no adenopathy, no carotid bruit, no JVD, supple, symmetrical, trachea midline and thyroid not enlarged, symmetric, no tenderness/mass/nodules Lungs: clear to auscultation bilaterally Breasts: ABNORMAL BREAST EXAM ON LEFT  SIDE 7:00 POSITION  normal appearance, no masses or tenderness Heart: regular rate and rhythm, S1, S2 normal, no murmur, click, rub or gallop Abdomen: soft, non-tender; bowel sounds normal; no masses,  no organomegaly Extremities: extremities normal, atraumatic, no cyanosis or edema Pulses: 2+ and symmetric Skin: Skin color, texture, turgor normal. No rashes or lesions Neurologic: Alert and oriented X 3, normal strength and tone. Normal symmetric reflexes. Normal coordination and gait.      Assessment & Plan:   Problem List Items Addressed This Visit    Obesity (BMI 30-39.9)    She is exercising regularly and following a careful diet which was reviewed in detail.  encouraged to consider hiring a Physiological scientist.  Screening labs normal.  Lab Results   Component Value Date   TSH 1.62 10/01/2018   Lab Results  Component Value Date   HGBA1C 5.6 10/01/2018    .  Lab Results  Component Value Date   CHOL 178 10/01/2018   HDL 48.80 10/01/2018   LDLCALC 102 (H) 10/01/2018   LDLDIRECT 72.0 09/27/2016   TRIG 136.0 10/01/2018   CHOLHDL 4 10/01/2018         Microcytic anemia    Last year's workup was Suggestive of iron deficiency,.  She ruled out for  Deficiencies of folate and B12 and hemoglobinopathies as well .  Currently resolved.   Lab Results  Component Value Date   WBC 8.2 10/01/2018   HGB 13.1 10/01/2018   HCT 39.2 10/01/2018   MCV 83.5 10/01/2018   PLT 407.0 (H) 10/01/2018         Encounter for routine adult health examination with abnormal findings    Annual wellness  exam was done as well as a comprehensive physical exam and management of acute and chronic conditions .   BREAST MASS WAS PALPATED AND DIAGNOSTIC MAMMOGRAM OF LEFT BREAST WAS ORDERED  During the course of the visit the patient was educated and counseled about appropriate screening and preventive services including :  diabetes screening, lipid analysis with projected  10 year  risk for CAD , nutrition counseling,  colorectal cancer screening, and recommended immunizations.  Printed recommendations for health maintenance screenings was given.        Other Visit Diagnoses    Screening for colon cancer    -  Primary   Relevant Orders   Ambulatory referral to Gastroenterology   Need for immunization against influenza       Relevant Orders   Flu Vaccine QUAD 36+ mos IM (Completed)   Obesity (BMI 30.0-34.9)       Relevant Orders   Comprehensive metabolic panel (Completed)   TSH (Completed)   Lipid panel (Completed)   Hemoglobin A1c (Completed)   Fatigue, unspecified type       Relevant Orders   CBC with Differential/Platelet (Completed)   Left breast mass       Relevant Orders   MM Digital Diagnostic Bilat   US BREAST COMPLETE UNI LEFT INC AXILLA       I have discontinued Alvita Halle's cyanocobalamin and Coenzyme Q10 (CO Q 10 PO). I am also having her maintain her MULTI-VITAMINS, spironolactone, BYSTOLIC, and Nutritional Supplements (DHEA PO).  No orders of the defined types were placed in this encounter.   Medications Discontinued During This Encounter  Medication Reason  . Coenzyme Q10 (CO Q 10 PO) Patient has not taken in last 30 days  . cyanocobalamin (,VITAMIN B-12,) 1000 MCG/ML injection Patient has not taken in last 30 days    Follow-up: No follow-ups on file.   Crecencio Mc, MD

## 2018-10-01 NOTE — Patient Instructions (Signed)
DIAGNOSTIC MAMMOGRAM HAS BEEN ORDERED TO EVALUATE THE LEFT BREAST  Referral to Dr Allen Norris for colonoscopy  Health Maintenance, Female Adopting a healthy lifestyle and getting preventive care can go a long way to promote health and wellness. Talk with your health care provider about what schedule of regular examinations is right for you. This is a good chance for you to check in with your provider about disease prevention and staying healthy. In between checkups, there are plenty of things you can do on your own. Experts have done a lot of research about which lifestyle changes and preventive measures are most likely to keep you healthy. Ask your health care provider for more information. Weight and diet Eat a healthy diet  Be sure to include plenty of vegetables, fruits, low-fat dairy products, and lean protein.  Do not eat a lot of foods high in solid fats, added sugars, or salt.  Get regular exercise. This is one of the most important things you can do for your health. ? Most adults should exercise for at least 150 minutes each week. The exercise should increase your heart rate and make you sweat (moderate-intensity exercise). ? Most adults should also do strengthening exercises at least twice a week. This is in addition to the moderate-intensity exercise. Maintain a healthy weight  Body mass index (BMI) is a measurement that can be used to identify possible weight problems. It estimates body fat based on height and weight. Your health care provider can help determine your BMI and help you achieve or maintain a healthy weight.  For females 26 years of age and older: ? A BMI below 18.5 is considered underweight. ? A BMI of 18.5 to 24.9 is normal. ? A BMI of 25 to 29.9 is considered overweight. ? A BMI of 30 and above is considered obese. Watch levels of cholesterol and blood lipids  You should start having your blood tested for lipids and cholesterol at 50 years of age, then have this test  every 5 years.  You may need to have your cholesterol levels checked more often if: ? Your lipid or cholesterol levels are high. ? You are older than 50 years of age. ? You are at high risk for heart disease. Cancer screening Lung Cancer  Lung cancer screening is recommended for adults 19-12 years old who are at high risk for lung cancer because of a history of smoking.  A yearly low-dose CT scan of the lungs is recommended for people who: ? Currently smoke. ? Have quit within the past 15 years. ? Have at least a 30-pack-year history of smoking. A pack year is smoking an average of one pack of cigarettes a day for 1 year.  Yearly screening should continue until it has been 15 years since you quit.  Yearly screening should stop if you develop a health problem that would prevent you from having lung cancer treatment. Breast Cancer  Practice breast self-awareness. This means understanding how your breasts normally appear and feel.  It also means doing regular breast self-exams. Let your health care provider know about any changes, no matter how small.  If you are in your 20s or 30s, you should have a clinical breast exam (CBE) by a health care provider every 1-3 years as part of a regular health exam.  If you are 44 or older, have a CBE every year. Also consider having a breast X-ray (mammogram) every year.  If you have a family history of breast cancer, talk to  your health care provider about genetic screening.  If you are at high risk for breast cancer, talk to your health care provider about having an MRI and a mammogram every year.  Breast cancer gene (BRCA) assessment is recommended for women who have family members with BRCA-related cancers. BRCA-related cancers include: ? Breast. ? Ovarian. ? Tubal. ? Peritoneal cancers.  Results of the assessment will determine the need for genetic counseling and BRCA1 and BRCA2 testing. Cervical Cancer Your health care provider may  recommend that you be screened regularly for cancer of the pelvic organs (ovaries, uterus, and vagina). This screening involves a pelvic examination, including checking for microscopic changes to the surface of your cervix (Pap test). You may be encouraged to have this screening done every 3 years, beginning at age 49.  For women ages 62-65, health care providers may recommend pelvic exams and Pap testing every 3 years, or they may recommend the Pap and pelvic exam, combined with testing for human papilloma virus (HPV), every 5 years. Some types of HPV increase your risk of cervical cancer. Testing for HPV may also be done on women of any age with unclear Pap test results.  Other health care providers may not recommend any screening for nonpregnant women who are considered low risk for pelvic cancer and who do not have symptoms. Ask your health care provider if a screening pelvic exam is right for you.  If you have had past treatment for cervical cancer or a condition that could lead to cancer, you need Pap tests and screening for cancer for at least 20 years after your treatment. If Pap tests have been discontinued, your risk factors (such as having a new sexual partner) need to be reassessed to determine if screening should resume. Some women have medical problems that increase the chance of getting cervical cancer. In these cases, your health care provider may recommend more frequent screening and Pap tests. Colorectal Cancer  This type of cancer can be detected and often prevented.  Routine colorectal cancer screening usually begins at 50 years of age and continues through 50 years of age.  Your health care provider may recommend screening at an earlier age if you have risk factors for colon cancer.  Your health care provider may also recommend using home test kits to check for hidden blood in the stool.  A small camera at the end of a tube can be used to examine your colon directly  (sigmoidoscopy or colonoscopy). This is done to check for the earliest forms of colorectal cancer.  Routine screening usually begins at age 28.  Direct examination of the colon should be repeated every 5-10 years through 50 years of age. However, you may need to be screened more often if early forms of precancerous polyps or small growths are found. Skin Cancer  Check your skin from head to toe regularly.  Tell your health care provider about any new moles or changes in moles, especially if there is a change in a mole's shape or color.  Also tell your health care provider if you have a mole that is larger than the size of a pencil eraser.  Always use sunscreen. Apply sunscreen liberally and repeatedly throughout the day.  Protect yourself by wearing long sleeves, pants, a wide-brimmed hat, and sunglasses whenever you are outside. Heart disease, diabetes, and high blood pressure  High blood pressure causes heart disease and increases the risk of stroke. High blood pressure is more likely to develop in: ?  People who have blood pressure in the high end of the normal range (130-139/85-89 mm Hg). ? People who are overweight or obese. ? People who are African American.  If you are 33-56 years of age, have your blood pressure checked every 3-5 years. If you are 15 years of age or older, have your blood pressure checked every year. You should have your blood pressure measured twice-once when you are at a hospital or clinic, and once when you are not at a hospital or clinic. Record the average of the two measurements. To check your blood pressure when you are not at a hospital or clinic, you can use: ? An automated blood pressure machine at a pharmacy. ? A home blood pressure monitor.  If you are between 61 years and 40 years old, ask your health care provider if you should take aspirin to prevent strokes.  Have regular diabetes screenings. This involves taking a blood sample to check your  fasting blood sugar level. ? If you are at a normal weight and have a low risk for diabetes, have this test once every three years after 51 years of age. ? If you are overweight and have a high risk for diabetes, consider being tested at a younger age or more often. Preventing infection Hepatitis B  If you have a higher risk for hepatitis B, you should be screened for this virus. You are considered at high risk for hepatitis B if: ? You were born in a country where hepatitis B is common. Ask your health care provider which countries are considered high risk. ? Your parents were born in a high-risk country, and you have not been immunized against hepatitis B (hepatitis B vaccine). ? You have HIV or AIDS. ? You use needles to inject street drugs. ? You live with someone who has hepatitis B. ? You have had sex with someone who has hepatitis B. ? You get hemodialysis treatment. ? You take certain medicines for conditions, including cancer, organ transplantation, and autoimmune conditions. Hepatitis C  Blood testing is recommended for: ? Everyone born from [birth year] through 1967. ? Anyone with known risk factors for hepatitis C. Sexually transmitted infections (STIs)  You should be screened for sexually transmitted infections (STIs) including gonorrhea and chlamydia if: ? You are sexually active and are younger than 50 years of age. ? You are older than 50 years of age and your health care provider tells you that you are at risk for this type of infection. ? Your sexual activity has changed since you were last screened and you are at an increased risk for chlamydia or gonorrhea. Ask your health care provider if you are at risk.  If you do not have HIV, but are at risk, it may be recommended that you take a prescription medicine daily to prevent HIV infection. This is called pre-exposure prophylaxis (PrEP). You are considered at risk if: ? You are sexually active and do not regularly use condoms or  know the HIV status of your partner(s). ? You take drugs by injection. ? You are sexually active with a partner who has HIV. Talk with your health care provider about whether you are at high risk of being infected with HIV. If you choose to begin PrEP, you should first be tested for HIV. You should then be tested every 3 months for as long as you are taking PrEP. Pregnancy  If you are premenopausal and you may become pregnant, ask your health care provider about  preconception counseling.  If you may become pregnant, take 400 to 800 micrograms (mcg) of folic acid every day.  If you want to prevent pregnancy, talk to your health care provider about birth control (contraception). Osteoporosis and menopause  Osteoporosis is a disease in which the bones lose minerals and strength with aging. This can result in serious bone fractures. Your risk for osteoporosis can be identified using a bone density scan.  If you are 68 years of age or older, or if you are at risk for osteoporosis and fractures, ask your health care provider if you should be screened.  Ask your health care provider whether you should take a calcium or vitamin D supplement to lower your risk for osteoporosis.  Menopause may have certain physical symptoms and risks.  Hormone replacement therapy may reduce some of these symptoms and risks. Talk to your health care provider about whether hormone replacement therapy is right for you. Follow these instructions at home:  Schedule regular health, dental, and eye exams.  Stay current with your immunizations.  Do not use any tobacco products including cigarettes, chewing tobacco, or electronic cigarettes.  If you are pregnant, do not drink alcohol.  If you are breastfeeding, limit how much and how often you drink alcohol.  Limit alcohol intake to no more than 1 drink per day for nonpregnant women. One drink equals 12 ounces of beer, 5 ounces of wine, or 1 ounces of hard  liquor.  Do not use street drugs.  Do not share needles.  Ask your health care provider for help if you need support or information about quitting drugs.  Tell your health care provider if you often feel depressed.  Tell your health care provider if you have ever been abused or do not feel safe at home. This information is not intended to replace advice given to you by your health care provider. Make sure you discuss any questions you have with your health care provider. Document Released: 02/19/2011 Document Revised: 01/12/2016 Document Reviewed: 05/10/2015 Elsevier Interactive Patient Education  2019 [employer].

## 2018-10-02 ENCOUNTER — Telehealth: Payer: Self-pay

## 2018-10-02 DIAGNOSIS — N6320 Unspecified lump in the left breast, unspecified quadrant: Secondary | ICD-10-CM

## 2018-10-02 NOTE — Telephone Encounter (Signed)
Orders corrected. 

## 2018-10-02 NOTE — Assessment & Plan Note (Signed)
Last year's workup was Suggestive of iron deficiency,.  She ruled out for  Deficiencies of folate and B12 and hemoglobinopathies as well .  Currently resolved.   Lab Results  Component Value Date   WBC 8.2 10/01/2018   HGB 13.1 10/01/2018   HCT 39.2 10/01/2018   MCV 83.5 10/01/2018   PLT 407.0 (H) 10/01/2018

## 2018-10-02 NOTE — Assessment & Plan Note (Signed)
Annual wellness  exam was done as well as a comprehensive physical exam and management of acute and chronic conditions .   BREAST MASS WAS PALPATED AND DIAGNOSTIC MAMMOGRAM OF LEFT BREAST WAS ORDERED  During the course of the visit the patient was educated and counseled about appropriate screening and preventive services including :  diabetes screening, lipid analysis with projected  10 year  risk for CAD , nutrition counseling, colorectal cancer screening, and recommended immunizations.  Printed recommendations for health maintenance screenings was given.

## 2018-10-02 NOTE — Assessment & Plan Note (Addendum)
She is exercising regularly and following a careful diet which was reviewed in detail.  encouraged to consider hiring a Physiological scientist.  Screening labs normal.  Lab Results  Component Value Date   TSH 1.62 10/01/2018   Lab Results  Component Value Date   HGBA1C 5.6 10/01/2018    .  Lab Results  Component Value Date   CHOL 178 10/01/2018   HDL 48.80 10/01/2018   LDLCALC 102 (H) 10/01/2018   LDLDIRECT 72.0 09/27/2016   TRIG 136.0 10/01/2018   CHOLHDL 4 10/01/2018

## 2018-10-09 ENCOUNTER — Ambulatory Visit
Admission: RE | Admit: 2018-10-09 | Discharge: 2018-10-09 | Disposition: A | Discharge status: Home / Self Care | Payer: 59 | Source: Ambulatory Visit | Attending: Internal Medicine | Admitting: Internal Medicine

## 2018-10-09 DIAGNOSIS — N6320 Unspecified lump in the left breast, unspecified quadrant: Secondary | ICD-10-CM

## 2018-10-21 ENCOUNTER — Encounter: Payer: Self-pay | Admitting: *Deleted

## 2019-05-08 ENCOUNTER — Other Ambulatory Visit: Payer: Self-pay

## 2019-05-08 ENCOUNTER — Ambulatory Visit (INDEPENDENT_AMBULATORY_CARE_PROVIDER_SITE_OTHER): Payer: 59

## 2019-05-08 DIAGNOSIS — Z23 Encounter for immunization: Secondary | ICD-10-CM

## 2019-12-02 ENCOUNTER — Other Ambulatory Visit: Payer: Self-pay

## 2019-12-04 ENCOUNTER — Other Ambulatory Visit: Payer: Self-pay

## 2019-12-04 ENCOUNTER — Ambulatory Visit (INDEPENDENT_AMBULATORY_CARE_PROVIDER_SITE_OTHER): Payer: 59 | Admitting: Internal Medicine

## 2019-12-04 ENCOUNTER — Encounter: Payer: Self-pay | Admitting: Internal Medicine

## 2019-12-04 VITALS — BP 136/88 | HR 84 | Temp 97.1°F | Resp 15 | Ht 62.0 in | Wt 207.8 lb

## 2019-12-04 DIAGNOSIS — R635 Abnormal weight gain: Secondary | ICD-10-CM

## 2019-12-04 DIAGNOSIS — E042 Nontoxic multinodular goiter: Secondary | ICD-10-CM | POA: Insufficient documentation

## 2019-12-04 DIAGNOSIS — Z0001 Encounter for general adult medical examination with abnormal findings: Secondary | ICD-10-CM

## 2019-12-04 DIAGNOSIS — E669 Obesity, unspecified: Secondary | ICD-10-CM | POA: Diagnosis not present

## 2019-12-04 DIAGNOSIS — R221 Localized swelling, mass and lump, neck: Secondary | ICD-10-CM | POA: Diagnosis not present

## 2019-12-04 DIAGNOSIS — Z1211 Encounter for screening for malignant neoplasm of colon: Secondary | ICD-10-CM | POA: Diagnosis not present

## 2019-12-04 DIAGNOSIS — R000 Tachycardia, unspecified: Secondary | ICD-10-CM

## 2019-12-04 DIAGNOSIS — Z124 Encounter for screening for malignant neoplasm of cervix: Secondary | ICD-10-CM

## 2019-12-04 LAB — HEMOGLOBIN A1C: Hgb A1c MFr Bld: 5.8 % (ref 4.6–6.5)

## 2019-12-04 LAB — COMPREHENSIVE METABOLIC PANEL
ALT: 17 U/L (ref 0–35)
AST: 17 U/L (ref 0–37)
Albumin: 4 g/dL (ref 3.5–5.2)
Alkaline Phosphatase: 83 U/L (ref 39–117)
BUN: 9 mg/dL (ref 6–23)
CO2: 25 mEq/L (ref 19–32)
Calcium: 9.1 mg/dL (ref 8.4–10.5)
Chloride: 105 mEq/L (ref 96–112)
Creatinine, Ser: 0.99 mg/dL (ref 0.40–1.20)
GFR: 71.63 mL/min (ref >60.00)
Glucose, Bld: 97 mg/dL (ref 70–99)
Potassium: 4 mEq/L (ref 3.5–5.1)
Sodium: 139 mEq/L (ref 135–145)
Total Bilirubin: 0.4 mg/dL (ref 0.2–1.2)
Total Protein: 6.9 g/dL (ref 6.0–8.3)

## 2019-12-04 LAB — LIPID PANEL
Cholesterol: 182 mg/dL (ref 0–200)
HDL: 47.6 mg/dL (ref >39.00)
LDL Cholesterol: 108 mg/dL — ABNORMAL HIGH (ref 0–99)
NonHDL: 134.18
Total CHOL/HDL Ratio: 4
Triglycerides: 132 mg/dL (ref 0.0–149.0)
VLDL: 26.4 mg/dL (ref 0.0–40.0)

## 2019-12-04 MED ORDER — SPIRONOLACTONE 25 MG PO TABS: ORAL_TABLET | ORAL | 1 refills | Status: DC

## 2019-12-04 MED ORDER — ATENOLOL 25 MG PO TABS: 25.0000 mg | ORAL_TABLET | Freq: Every day | ORAL | 1 refills | Status: DC

## 2019-12-04 NOTE — Assessment & Plan Note (Signed)

## 2019-12-04 NOTE — Addendum Note (Signed)
Addended by: Tor Netters I on: 12/04/2019 10:23 AM   Modules accepted: Orders

## 2019-12-04 NOTE — Progress Notes (Signed)
Patient ID: Theresa Fox, female    DOB: [birth year]  Age: 49 y.o. MRN: [id number]  The patient is here for annual wellness examination and management of other chronic and acute problems.  This visit occurred during the SARS-CoV-2 public health emergency.  Safety protocols were in place, including screening questions prior to the visit, additional usage of staff PPE, and extensive cleaning of exam room while observing appropriate contact time as indicated for disinfecting solutions.    Patient has received both doses of the available COVID 19 vaccine without complications.  Patient continues to mask when outside of the home except when walking in yard or at safe distances from others .  Patient denies any change in mood or development of unhealthy behaviors resuting from the pandemic's restriction of activities and socialization.     The risk factors are reflected in the social history.  The roster of all physicians providing medical care to patient - is listed in the Snapshot section of the chart.  Activities of daily living:  The patient is 100% independent in all ADLs: dressing, toileting, feeding as well as independent mobility  Home safety : The patient has smoke detectors in the home. They wear seatbelts.  There are no firearms at home. There is no violence in the home.   There is no risks for hepatitis, STDs or HIV. There is no   history of blood transfusion. They have no travel history to infectious disease endemic areas of the world.  The patient has seen their dentist in the last six month. They have seen their eye doctor in the last year. They admit to slight hearing difficulty with regard to whispered voices and some television programs.  They have deferred audiologic testing in the last year.  They do not  have excessive sun exposure. Discussed the need for sun protection: hats, long sleeves and use of sunscreen if there is significant sun exposure.   Diet: the importance of a healthy  diet is discussed. They do have a healthy diet.  The benefits of regular aerobic exercise were discussed. She walks 4 times per week ,  20 minutes.   Depression screen: there are no signs or vegative symptoms of depression- irritability, change in appetite, anhedonia, sadness/tearfullness.  The following portions of the patient's history were reviewed and updated as appropriate: allergies, current medications, past family history, past medical history,  past surgical history, past social history  and problem list.  Visual acuity was not assessed per patient preference since she has regular follow up with her ophthalmologist. Hearing and body mass index were assessed and reviewed.   During the course of the visit the patient was educated and counseled about appropriate screening and preventive services including : fall prevention , diabetes screening, nutrition counseling, colorectal cancer screening, and recommended immunizations.    CC: The primary encounter diagnosis was Neck fullness. Diagnoses of Weight gain, Colon cancer screening, Obesity (BMI 30-39.9), Encounter for routine adult health examination with abnormal findings, Screening for cervical cancer, and Tachycardia were also pertinent to this visit.  1) menopause: dtr reporting mood swings,  Hot flashes   2) HM:  Had PAP smear last year,  Normal,  New doc. Periods occurring monthly. Getting annual mammograms   3) needs colonoscopy ,  Theresa Fox.   4) Obesity:  Has appt with [facility] Fox   In [place]  [id number]) HTN:  Using Bystolic prn for HR due to recurrent hypotension  History Theresa Fox has a past medical history of Hypertension and Supraventricular tachycardia, nonsustained ([place]).   She has a past surgical history that includes Cesarean section classical.   Her family history includes Coronary artery disease in her father; Hypertension in her father and mother.She reports that she has  never smoked. She has never used smokeless tobacco. She reports current alcohol use of about 7.0 standard drinks of alcohol per week. She reports that she does not use drugs.  Outpatient Medications Prior to Visit  Medication Sig Dispense Refill  . BYSTOLIC 5 MG tablet TAKE 1 TABLET BY MOUTH  DAILY (Patient taking differently: 1/2/ tablet daily as needed) 90 tablet 1  . Multiple Vitamin (MULTI-VITAMINS) TABS Take 1 tablet by mouth daily.    Marland Kitchen spironolactone (ALDACTONE) 25 MG tablet TAKE 1 TABLET BY MOUTH  DAILY AS NEEDED FOR FLUID  RETENTION 90 tablet 1  . Nutritional Supplements (DHEA PO) Take by mouth.     No facility-administered medications prior to visit.    Review of Systems  Objective:  BP 136/88 (BP Location: Left Arm, Patient Position: Sitting, Cuff Size: Normal)   Pulse 84   Temp (!) 97.1 F (36.2 C) (Temporal)   Resp 15   Ht 5\' 2"  (1.575 m)   Wt 207 lb 12.8 oz (94.3 kg)   SpO2 99%   BMI 38.01 kg/m   Physical Exam    Assessment & Plan:   Problem List Items Addressed This Visit      Unprioritized   Screening for cervical cancer    Done in 2019,  And again in 2020 by GYN.  Both normal        Encounter for routine adult health examination with abnormal findings    age appropriate education and counseling updated, referrals for preventative services and immunizations addressed, dietary and smoking counseling addressed, most recent labs reviewed.  I have personally reviewed and have noted:  1) the patient's medical and social history 2) The pt's use of alcohol, tobacco, and illicit drugs 3) The patient's current medications and supplements 4) Functional ability including ADL's, fall risk, home safety risk, hearing and visual impairment 5) Diet and physical activities 6) Evidence for depression or mood disorder 7) The patient's height, weight, and BMI have been recorded in the chart  I have made referrals, and provided counseling and education based on review  of the above      Obesity (BMI 30-39.9)    Metabolic labs ordered as weight management [facility] requests it       Neck fullness - Primary    She has a FH of Thyroid cancer.   Ultrasound and thryoid profile ordered       Relevant Orders   US Soft Tissue Head/Neck (NON-THYROID)   Tachycardia    Using bystolic prn due to recurrent hypotension .  Trial of atenolol        Other Visit Diagnoses    Weight gain       Relevant Orders   Thyroid Panel With TSH   Hemoglobin A1c   Comprehensive metabolic panel   Lipid panel   Insulin, Free and Total   Colon cancer screening       Relevant Orders   Ambulatory referral to Gastroenterology      I have discontinued Jolynne Tafolla's Nutritional Supplements (DHEA PO). I am also having her start on atenolol. Additionally, I am having her maintain her Multi-Vitamins, spironolactone, and Bystolic.  Meds ordered this encounter  Medications  . atenolol (  TENORMIN) 25 MG tablet    Sig: Take 1 tablet (25 mg total) by mouth daily.    Dispense:  60 tablet    Refill:  1    Medications Discontinued During This Encounter  Medication Reason  . Nutritional Supplements (DHEA PO) Patient has not taken in last 30 days    Follow-up: No follow-ups on file.   Crecencio Mc, MD

## 2019-12-04 NOTE — Assessment & Plan Note (Signed)
Done in 2019,  And again in 2020 by GYN.  Both normal

## 2019-12-04 NOTE — Assessment & Plan Note (Signed)
She has a FH of Thyroid cancer.   Ultrasound and thryoid profile ordered

## 2019-12-04 NOTE — Assessment & Plan Note (Signed)
Metabolic labs ordered as weight management center requests it

## 2019-12-04 NOTE — Patient Instructions (Signed)
Referral to Dr Allen Norris in progress  Most labs will be available by this afternoon   Health Maintenance, Female Adopting a healthy lifestyle and getting preventive care are important in promoting health and wellness. Ask your health care provider about:  The right schedule for you to have regular tests and exams.  Things you can do on your own to prevent diseases and keep yourself healthy. What should I know about diet, weight, and exercise? Eat a healthy diet   Eat a diet that includes plenty of vegetables, fruits, low-fat dairy products, and lean protein.  Do not eat a lot of foods that are high in solid fats, added sugars, or sodium. Maintain a healthy weight Body mass index (BMI) is used to identify weight problems. It estimates body fat based on height and weight. Your health care provider can help determine your BMI and help you achieve or maintain a healthy weight. Get regular exercise Get regular exercise. This is one of the most important things you can do for your health. Most adults should:  Exercise for at least 150 minutes each week. The exercise should increase your heart rate and make you sweat (moderate-intensity exercise).  Do strengthening exercises at least twice a week. This is in addition to the moderate-intensity exercise.  Spend less time sitting. Even light physical activity can be beneficial. Watch cholesterol and blood lipids Have your blood tested for lipids and cholesterol at 51 years of age, then have this test every 5 years. Have your cholesterol levels checked more often if:  Your lipid or cholesterol levels are high.  You are older than 51 years of age.  You are at high risk for heart disease. What should I know about cancer screening? Depending on your health history and family history, you may need to have cancer screening at various ages. This may include screening for:  Breast cancer.  Cervical cancer.  Colorectal cancer.  Skin  cancer.  Lung cancer. What should I know about heart disease, diabetes, and high blood pressure? Blood pressure and heart disease  High blood pressure causes heart disease and increases the risk of stroke. This is more likely to develop in people who have high blood pressure readings, are of African descent, or are overweight.  Have your blood pressure checked: ? Every 3-5 years if you are 24-51 years of age. ? Every year if you are 51 years old or older. Diabetes Have regular diabetes screenings. This checks your fasting blood sugar level. Have the screening done:  Once every three years after age 51 if you are at a normal weight and have a low risk for diabetes.  More often and at a younger age if you are overweight or have a high risk for diabetes. What should I know about preventing infection? Hepatitis B If you have a higher risk for hepatitis B, you should be screened for this virus. Talk with your health care provider to find out if you are at risk for hepatitis B infection. Hepatitis C Testing is recommended for:  Everyone born from [birth year] through 1967.  Anyone with known risk factors for hepatitis C. Sexually transmitted infections (STIs)  Get screened for STIs, including gonorrhea and chlamydia, if: ? You are sexually active and are younger than 51 years of age. ? You are older than 51 years of age and your health care provider tells you that you are at risk for this type of infection. ? Your sexual activity has changed since you were last  screened, and you are at increased risk for chlamydia or gonorrhea. Ask your health care provider if you are at risk.  Ask your health care provider about whether you are at high risk for HIV. Your health care provider may recommend a prescription medicine to help prevent HIV infection. If you choose to take medicine to prevent HIV, you should first get tested for HIV. You should then be tested every 3 months for as long as you are taking  the medicine. Pregnancy  If you are about to stop having your period (premenopausal) and you may become pregnant, seek counseling before you get pregnant.  Take 400 to 800 micrograms (mcg) of folic acid every day if you become pregnant.  Ask for birth control (contraception) if you want to prevent pregnancy. Osteoporosis and menopause Osteoporosis is a disease in which the bones lose minerals and strength with aging. This can result in bone fractures. If you are 82 years old or older, or if you are at risk for osteoporosis and fractures, ask your health care provider if you should:  Be screened for bone loss.  Take a calcium or vitamin D supplement to lower your risk of fractures.  Be given hormone replacement therapy (HRT) to treat symptoms of menopause. Follow these instructions at home: Lifestyle  Do not use any products that contain nicotine or tobacco, such as cigarettes, e-cigarettes, and chewing tobacco. If you need help quitting, ask your health care provider.  Do not use street drugs.  Do not share needles.  Ask your health care provider for help if you need support or information about quitting drugs. Alcohol use  Do not drink alcohol if: ? Your health care provider tells you not to drink. ? You are pregnant, may be pregnant, or are planning to become pregnant.  If you drink alcohol: ? Limit how much you use to 0-1 drink a day. ? Limit intake if you are breastfeeding.  Be aware of how much alcohol is in your drink. In the [place] one drink equals one 12 oz bottle of beer (355 mL), one 5 oz glass of wine (148 mL), or one 1 oz glass of hard liquor (44 mL). General instructions  Schedule regular health, dental, and eye exams.  Stay current with your vaccines.  Tell your health care provider if: ? You often feel depressed. ? You have ever been abused or do not feel safe at home. Summary  Adopting a healthy lifestyle and getting preventive care are important in  promoting health and wellness.  Follow your health care provider's instructions about healthy diet, exercising, and getting tested or screened for diseases.  Follow your health care provider's instructions on monitoring your cholesterol and blood pressure. This information is not intended to replace advice given to you by your health care provider. Make sure you discuss any questions you have with your health care provider. Document Revised: 07/30/2018 Document Reviewed: 07/30/2018 Elsevier Patient Education  2020 [employer].

## 2019-12-04 NOTE — Assessment & Plan Note (Signed)
Using bystolic prn due to recurrent hypotension .  Trial of atenolol

## 2019-12-11 ENCOUNTER — Other Ambulatory Visit: Payer: Self-pay

## 2019-12-11 ENCOUNTER — Ambulatory Visit
Admission: RE | Admit: 2019-12-11 | Discharge: 2019-12-11 | Disposition: A | Discharge status: Home / Self Care | Payer: 59 | Source: Ambulatory Visit | Attending: Internal Medicine | Admitting: Internal Medicine

## 2019-12-11 DIAGNOSIS — R221 Localized swelling, mass and lump, neck: Secondary | ICD-10-CM | POA: Diagnosis present

## 2019-12-14 LAB — THYROID PANEL WITH TSH
Free Thyroxine Index: 2 (ref 1.2–4.9)
T3 Uptake Ratio: 28 % (ref 24–39)
T4, Total: 7.2 ug/dL (ref 4.5–12.0)
TSH: 1.6 u[IU]/mL (ref 0.450–4.500)

## 2019-12-14 LAB — INSULIN, FREE AND TOTAL
Free Insulin: 8.7 uU/mL
Total Insulin: 8.7 uU/mL

## 2019-12-15 ENCOUNTER — Encounter: Payer: Self-pay | Admitting: Internal Medicine

## 2019-12-15 DIAGNOSIS — E042 Nontoxic multinodular goiter: Secondary | ICD-10-CM

## 2019-12-15 DIAGNOSIS — E041 Nontoxic single thyroid nodule: Secondary | ICD-10-CM

## 2019-12-15 DIAGNOSIS — Z808 Family history of malignant neoplasm of other organs or systems: Secondary | ICD-10-CM

## 2019-12-28 ENCOUNTER — Ambulatory Visit (INDEPENDENT_AMBULATORY_CARE_PROVIDER_SITE_OTHER): Payer: 59 | Admitting: Nurse Practitioner

## 2019-12-28 ENCOUNTER — Other Ambulatory Visit: Payer: Self-pay

## 2019-12-28 ENCOUNTER — Encounter: Payer: Self-pay | Admitting: Nurse Practitioner

## 2019-12-28 ENCOUNTER — Telehealth: Payer: Self-pay

## 2019-12-28 VITALS — BP 140/90 | HR 83 | Temp 97.8°F | Ht 62.0 in | Wt 210.6 lb

## 2019-12-28 DIAGNOSIS — G8929 Other chronic pain: Secondary | ICD-10-CM | POA: Insufficient documentation

## 2019-12-28 DIAGNOSIS — M25561 Pain in right knee: Secondary | ICD-10-CM | POA: Diagnosis not present

## 2019-12-28 NOTE — Telephone Encounter (Signed)
LMTCB so pt can be further triage about her left leg/knee swelling.

## 2019-12-28 NOTE — Patient Instructions (Addendum)
It was really nice to meet you today.  I have sent you [facility] today for your right knee pain and swelling.

## 2019-12-28 NOTE — Progress Notes (Addendum)
Established Patient Office Visit  Subjective:  Patient ID: Theresa Fox, female    DOB: [birth year]  Age: 49 y.o. MRN: [id number]  CC:  Chief Complaint  Patient presents with  . Acute Visit    right knee swelling    HPI Theresa Fox is a 49 yo patient of Dr. Lupita Dawn who presents for right knee swelling- uncomfortable, she can tell some swelling > 5 days ago.  She has had no specific injury or trauma to the knees however she has been doing exercise.  She has done squats with weights, lunges, but has noted no specific pain with the exercise.  She developed tenderness to diffuse throughout the knee, some swelling, and has placed ice on it, elevated, and has taken ibuprofen.  She took  ibuprofen 400 mg today. Yesterday 800 mg and 200 mg and it helps.  She is going on a [place] trip in 48 hours and would like need to be improved so she can walk without discomfort.  She denies prior history of knee trauma, or arthritis.  Past Medical History:  Diagnosis Date  . Hypertension   . Supraventricular tachycardia, nonsustained [facility])     Past Surgical History:  Procedure Laterality Date  . CESAREAN SECTION CLASSICAL      Family History  Problem Relation Age of Onset  . Hypertension Mother   . Hypertension Father   . Coronary artery disease Father   . Breast cancer Neg Hx     Social History   Socioeconomic History  . Marital status: Single    Spouse name: Not on file  . Number of children: Not on file  . Years of education: Not on file  . Highest education level: Not on file  Occupational History  . 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 Smoking status: Never Smoker  . Smokeless tobacco: Never Used  Substance and Sexual Activity  . Alcohol use: Yes    Alcohol/week: 7.0 standard drinks    Types: 7 Standard drinks or equivalent per week  . Drug use: No  . Sexual activity: Yes    Partners: Male  Other Topics Concern  . Not  on file  Social History Narrative  . Not on file   Social Determinants of Health   Financial Resource Strain:   . Difficulty of Paying Living Expenses:   Food Insecurity:   . Worried About Charity fundraiser in the Last Year:   . Arboriculturist in the Last Year:   Transportation Needs:   . Film/video editor (Medical):   Marland Kitchen Lack of Transportation (Non-Medical):   Physical Activity:   . Days of Exercise per Week:   . Minutes of Exercise per Session:   Stress:   . Feeling of Stress :   Social Connections:   . Frequency of Communication with Friends and Family:   . Frequency of Social Gatherings with Friends and Family:   . Attends Religious Services:   . Active Member of Clubs or Organizations:   . Attends Archivist Meetings:   Marland Kitchen Marital Status:   Intimate Partner Violence:   . Fear of Current or Ex-Partner:   . Emotionally Abused:   Marland Kitchen Physically Abused:   . Sexually Abused:     Outpatient Medications Prior to Visit  Medication Sig Dispense Refill  . ascorbic acid (VITAMIN C) 1000 MG tablet     . atenolol (  TENORMIN) 25 MG tablet Take 1 tablet (25 mg total) by mouth daily. 60 tablet 1  . Biotin 10 MG CAPS     . cyanocobalamin 1000 MCG tablet     . Multiple Vitamin (MULTI-VITAMINS) TABS Take 1 tablet by mouth daily.    Marland Kitchen spironolactone (ALDACTONE) 25 MG tablet TAKE 1 TABLET BY MOUTH  DAILY AS NEEDED FOR FLUID  RETENTION 90 tablet 1  . BYSTOLIC 5 MG tablet TAKE 1 TABLET BY MOUTH  DAILY (Patient taking differently: 1/2/ tablet daily as needed) 90 tablet 1   No facility-administered medications prior to visit.    No Known Allergies  ROS Review of Systems  Constitutional: Negative.  Negative for chills and fever.  HENT: Negative for congestion and sinus pressure.   Eyes: Negative.   Respiratory: Negative for cough and shortness of breath.   Cardiovascular: Negative for chest pain, palpitations and leg swelling.  Gastrointestinal: Negative for abdominal  pain.  Endocrine:       Getting a thyroid biopsy in near future.   Genitourinary: Negative for dysuria.  Musculoskeletal: Positive for arthralgias.  Skin: Negative.   Neurological: Negative.   Hematological: Negative.   Psychiatric/Behavioral: Negative.       Objective:    Physical Exam  Constitutional: She is oriented to person, place, and time. She appears well-developed and well-nourished.  Musculoskeletal:     Comments: Right knee with loss of contour and swelling present compared to left knee. Tenderness-general knee - non specific. No baker's cyst- no soft mass posterior knee. No calf tenderness or lower ext swelling. Full extension and flexion.No hip tenderness and hips with normal ROM.  Walks without limp.   Neurological: She is alert and oriented to person, place, and time.  Skin: Skin is dry.  Psychiatric: She has a normal mood and affect. Her behavior is normal. Judgment and thought content normal.  Vitals reviewed.   BP 140/90 (BP Location: Left Arm, Patient Position: Sitting, Cuff Size: Small)   Pulse 83   Temp 97.8 F (36.6 C) (Skin)   Ht 5\' 2"  (1.575 m)   Wt 210 lb 9.6 oz (95.5 kg)   SpO2 99%   BMI 38.52 kg/m  Wt Readings from Last 3 Encounters:  12/28/19 210 lb 9.6 oz (95.5 kg)  12/04/19 207 lb 12.8 oz (94.3 kg)  10/01/18 196 lb (88.9 kg)     Health Maintenance Due  Topic Date Due  . HIV Screening  Never done  . COLONOSCOPY  Never done    There are no preventive care reminders to display for this patient.  Lab Results  Component Value Date   TSH 1.600 12/04/2019   Lab Results  Component Value Date   WBC 8.2 10/01/2018   HGB 13.1 10/01/2018   HCT 39.2 10/01/2018   MCV 83.5 10/01/2018   PLT 407.0 (H) 10/01/2018   Lab Results  Component Value Date   NA 139 12/04/2019   K 4.0 12/04/2019   CO2 25 12/04/2019   GLUCOSE 97 12/04/2019   BUN 9 12/04/2019   CREATININE 0.99 12/04/2019   BILITOT 0.4 12/04/2019   ALKPHOS 83 12/04/2019   AST 17  12/04/2019   ALT 17 12/04/2019   PROT 6.9 12/04/2019   ALBUMIN 4.0 12/04/2019   CALCIUM 9.1 12/04/2019   GFR 71.63 12/04/2019   Lab Results  Component Value Date   CHOL 182 12/04/2019   Lab Results  Component Value Date   HDL 47.60 12/04/2019   Lab Results  Component  Value Date   LDLCALC 108 (H) 12/04/2019   Lab Results  Component Value Date   TRIG 132.0 12/04/2019   Lab Results  Component Value Date   CHOLHDL 4 12/04/2019   Lab Results  Component Value Date   HGBA1C 5.8 12/04/2019      Assessment & Plan:   Problem List Items Addressed This Visit      Other   Acute pain of right knee - Primary   Relevant Orders   DG Knee Complete 4 Views Left      No orders of the defined types were placed in this encounter.   She tells me she  needs to be ready for [place] in 48 hours .  We discussed rest, ice, elevation, compression wrap and definitve treatment option with her [place] time frame in mind. I initially ordered  Rt knee X-ray, but after discussion, cancelled the order. Sent to [facility] Walk[facility] care. I considered X-rays- but they will want to do their own. She may benefit from a knee injection if indicated after Ortho evaluates.   I provided  20 minutes of face-to-face time during this encounter reviewing patient's current problems, examining, and providing counseling on the above mentioned problems , and coordination  of care .  Follow-up: Return if symptoms worsen or fail to improve.   This visit occurred during the SARS-CoV-2 public health emergency.  Safety protocols were in place, including screening questions prior to the visit, additional usage of staff PPE, and extensive cleaning of exam room while observing appropriate contact time as indicated for disinfecting solutions.   Denice Paradise, NP

## 2020-01-11 ENCOUNTER — Encounter (INDEPENDENT_AMBULATORY_CARE_PROVIDER_SITE_OTHER): Payer: Self-pay | Admitting: Otolaryngology

## 2020-01-11 ENCOUNTER — Ambulatory Visit: Payer: 59 | Admitting: Internal Medicine

## 2020-01-11 ENCOUNTER — Other Ambulatory Visit: Payer: Self-pay

## 2020-01-11 ENCOUNTER — Ambulatory Visit (INDEPENDENT_AMBULATORY_CARE_PROVIDER_SITE_OTHER): Payer: 59 | Admitting: Otolaryngology

## 2020-01-11 VITALS — Temp 97.5°F

## 2020-01-11 DIAGNOSIS — E041 Nontoxic single thyroid nodule: Secondary | ICD-10-CM | POA: Diagnosis not present

## 2020-01-11 NOTE — Progress Notes (Signed)
HPI: Theresa Fox is a 49 y.o. female who presents is referred by Dr. Derrel Nip For evaluation of right multinodular goiter.  Patient has noted slight swelling of her right lower neck over the past several months and recently underwent an ultrasound that demonstrated multiple right thyroid nodules with 1 nodule meeting RADS requirement for FNA. She has had no pain associated with this.  No hoarseness.  Past Medical History:  Diagnosis Date  . Hypertension   . Supraventricular tachycardia, nonsustained [facility])    Past Surgical History:  Procedure Laterality Date  . CESAREAN SECTION CLASSICAL     Social History   Socioeconomic History  . Marital status: Single    Spouse name: Not on file  . Number of children: Not on file  . Years of education: Not on file  . Highest education level: Not on file  Occupational History  . 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 Smoking status: Never Smoker  . Smokeless tobacco: Never Used  Substance and Sexual Activity  . Alcohol use: Yes    Alcohol/week: 7.0 standard drinks    Types: 7 Standard drinks or equivalent per week  . Drug use: No  . Sexual activity: Yes    Partners: Male  Other Topics Concern  . Not on file  Social History Narrative  . Not on file   Social Determinants of Health   Financial Resource Strain:   . Difficulty of Paying Living Expenses:   Food Insecurity:   . Worried About Charity fundraiser in the Last Year:   . Arboriculturist in the Last Year:   Transportation Needs:   . Film/video editor (Medical):   Marland Kitchen Lack of Transportation (Non-Medical):   Physical Activity:   . Days of Exercise per Week:   . Minutes of Exercise per Session:   Stress:   . Feeling of Stress :   Social Connections:   . Frequency of Communication with Friends and Family:   . Frequency of Social Gatherings with Friends and Family:   . Attends Religious Services:   . Active Member of  Clubs or Organizations:   . Attends Archivist Meetings:   Marland Kitchen Marital Status:    Family History  Problem Relation Age of Onset  . Hypertension Mother   . Hypertension Father   . Coronary artery disease Father   . Breast cancer Neg Hx    No Known Allergies Prior to Admission medications   Medication Sig Start Date End Date Taking? Authorizing Provider  ascorbic acid (VITAMIN C) 1000 MG tablet  12/04/19  Yes [provider]  atenolol (TENORMIN) 25 MG tablet Take 1 tablet (25 mg total) by mouth daily. 12/04/19  Yes Crecencio Mc, MD  Biotin 10 MG CAPS  12/04/19  Yes [provider]  cyanocobalamin 1000 MCG tablet  12/04/19  Yes [provider]  Multiple Vitamin (MULTI-VITAMINS) TABS Take 1 tablet by mouth daily.   Yes [provider]  spironolactone (ALDACTONE) 25 MG tablet TAKE 1 TABLET BY MOUTH  DAILY AS NEEDED FOR FLUID  RETENTION 12/04/19  Yes Crecencio Mc, MD     Positive ROS: Otherwise negative  All other systems have been reviewed and were otherwise negative with the exception of those mentioned in the HPI and as above.  Physical Exam: Constitutional: Alert, well-appearing, no acute distress Ears: External ears without lesions or tenderness. Ear canals  are clear bilaterally with intact, clear TMs.  Nasal: External nose without lesions. Septum midline with mild rhinitis.. Clear nasal passages Oral: Lips and gums without lesions. Tongue and palate mucosa without lesions. Posterior oropharynx clear. Neck: She has palpable enlarged slightly nodular right thyroid lobe.  No obvious palpable supraclavicular adenopathy noted.  No adenopathy noted along the jugular chain of lymph nodes. Respiratory: Breathing comfortably  Skin: No facial/neck lesions or rash noted.  Procedures  Assessment: Right-sided multinodular goiter with ultrasound demonstrating 1 nodule meeting requirement for fine-needle aspirate.  Plan: We will plan on  scheduling patient for ultrasound-guided fine-needle aspirate of the right thyroid nodule #2. Patient will call us concerning results of the fine-needle aspirate 3 to 4 days post procedure.   Radene Journey, MD   CC:

## 2020-01-12 ENCOUNTER — Other Ambulatory Visit (INDEPENDENT_AMBULATORY_CARE_PROVIDER_SITE_OTHER): Payer: Self-pay

## 2020-01-12 DIAGNOSIS — E041 Nontoxic single thyroid nodule: Secondary | ICD-10-CM

## 2020-01-26 ENCOUNTER — Ambulatory Visit
Admission: RE | Admit: 2020-01-26 | Discharge: 2020-01-26 | Disposition: A | Discharge status: Home / Self Care | Payer: 59 | Source: Ambulatory Visit | Attending: Otolaryngology | Admitting: Otolaryngology

## 2020-01-26 ENCOUNTER — Other Ambulatory Visit (HOSPITAL_COMMUNITY)
Admission: RE | Admit: 2020-01-26 | Discharge: 2020-01-26 | Disposition: A | Discharge status: Home / Self Care | Payer: 59 | Source: Ambulatory Visit | Attending: Radiology | Admitting: Radiology

## 2020-01-26 DIAGNOSIS — E041 Nontoxic single thyroid nodule: Secondary | ICD-10-CM | POA: Insufficient documentation

## 2020-01-27 LAB — CYTOLOGY - NON PAP

## 2020-03-16 ENCOUNTER — Telehealth: Payer: Self-pay | Admitting: Internal Medicine

## 2020-03-17 NOTE — Telephone Encounter (Signed)
No message attached

## 2020-04-12 NOTE — Telephone Encounter (Signed)
I apologize,  Rejection Reason - Patient did not respond" [facility] said 1 day ago

## 2020-04-28 ENCOUNTER — Telehealth (INDEPENDENT_AMBULATORY_CARE_PROVIDER_SITE_OTHER): Payer: Self-pay | Admitting: Otolaryngology

## 2020-04-28 NOTE — Telephone Encounter (Signed)
Called patient concerning results of her fine-needle aspirate performed in June of the right thyroid nodule.  This was read as scant follicular epithelium Bethesda category 1.  After discussion with the pathologist Dr. Melina Copa it was recommended that she have repeat fine-needle aspiration performed in the next month.  We will plan on scheduling repeat FNA in September or October.

## 2020-05-02 ENCOUNTER — Other Ambulatory Visit (INDEPENDENT_AMBULATORY_CARE_PROVIDER_SITE_OTHER): Payer: Self-pay

## 2020-05-02 DIAGNOSIS — E041 Nontoxic single thyroid nodule: Secondary | ICD-10-CM

## 2020-05-19 ENCOUNTER — Ambulatory Visit
Admission: RE | Admit: 2020-05-19 | Discharge: 2020-05-19 | Disposition: A | Discharge status: Home / Self Care | Payer: 59 | Source: Ambulatory Visit | Attending: Otolaryngology | Admitting: Otolaryngology

## 2020-05-19 ENCOUNTER — Other Ambulatory Visit (HOSPITAL_COMMUNITY)
Admission: RE | Admit: 2020-05-19 | Discharge: 2020-05-19 | Disposition: A | Discharge status: Home / Self Care | Payer: 59 | Source: Ambulatory Visit | Attending: Radiology | Admitting: Radiology

## 2020-05-19 DIAGNOSIS — D34 Benign neoplasm of thyroid gland: Secondary | ICD-10-CM | POA: Insufficient documentation

## 2020-05-19 DIAGNOSIS — E041 Nontoxic single thyroid nodule: Secondary | ICD-10-CM

## 2020-05-22 LAB — CYTOLOGY - NON PAP

## 2020-05-24 ENCOUNTER — Telehealth (INDEPENDENT_AMBULATORY_CARE_PROVIDER_SITE_OTHER): Payer: Self-pay | Admitting: Otolaryngology

## 2020-05-24 NOTE — Telephone Encounter (Signed)
Called [facility] concerning results over FNA of the thyroid nodule.  Final path report revealed benign follicular nodule Bethesda category II.Marland Kitchen  Reviewed with her that this is a benign nodule and no further therapy is needed.  She can follow-up with her medical doctor or endocrinologist concerning further treatment of her goiter.

## 2020-08-10 ENCOUNTER — Other Ambulatory Visit: Payer: Self-pay

## 2020-08-10 MED ORDER — ATENOLOL 25 MG PO TABS: 25.0000 mg | ORAL_TABLET | Freq: Every day | ORAL | 1 refills | Status: DC

## 2020-09-13 ENCOUNTER — Encounter: Payer: Self-pay | Admitting: *Deleted

## 2020-12-07 ENCOUNTER — Encounter: Payer: Self-pay | Admitting: Internal Medicine

## 2020-12-07 ENCOUNTER — Ambulatory Visit (INDEPENDENT_AMBULATORY_CARE_PROVIDER_SITE_OTHER): Payer: 59 | Admitting: Internal Medicine

## 2020-12-07 ENCOUNTER — Other Ambulatory Visit: Payer: Self-pay

## 2020-12-07 VITALS — BP 122/92 | HR 74 | Temp 98.3°F | Resp 15 | Ht 62.0 in | Wt 181.2 lb

## 2020-12-07 DIAGNOSIS — E042 Nontoxic multinodular goiter: Secondary | ICD-10-CM

## 2020-12-07 DIAGNOSIS — D509 Iron deficiency anemia, unspecified: Secondary | ICD-10-CM

## 2020-12-07 DIAGNOSIS — E559 Vitamin D deficiency, unspecified: Secondary | ICD-10-CM

## 2020-12-07 DIAGNOSIS — E669 Obesity, unspecified: Secondary | ICD-10-CM | POA: Diagnosis not present

## 2020-12-07 DIAGNOSIS — Z1211 Encounter for screening for malignant neoplasm of colon: Secondary | ICD-10-CM | POA: Diagnosis not present

## 2020-12-07 DIAGNOSIS — Z1231 Encounter for screening mammogram for malignant neoplasm of breast: Secondary | ICD-10-CM

## 2020-12-07 DIAGNOSIS — R030 Elevated blood-pressure reading, without diagnosis of hypertension: Secondary | ICD-10-CM

## 2020-12-07 DIAGNOSIS — R000 Tachycardia, unspecified: Secondary | ICD-10-CM

## 2020-12-07 DIAGNOSIS — Z113 Encounter for screening for infections with a predominantly sexual mode of transmission: Secondary | ICD-10-CM | POA: Diagnosis not present

## 2020-12-07 DIAGNOSIS — Z0001 Encounter for general adult medical examination with abnormal findings: Secondary | ICD-10-CM | POA: Diagnosis not present

## 2020-12-07 LAB — LIPID PANEL
Cholesterol: 160 mg/dL (ref 0–200)
HDL: 62.4 mg/dL (ref >39.00)
LDL Cholesterol: 83 mg/dL (ref 0–99)
NonHDL: 97.14
Total CHOL/HDL Ratio: 3
Triglycerides: 70 mg/dL (ref 0.0–149.0)
VLDL: 14 mg/dL (ref 0.0–40.0)

## 2020-12-07 LAB — COMPREHENSIVE METABOLIC PANEL
ALT: 16 U/L (ref 0–35)
AST: 17 U/L (ref 0–37)
Albumin: 4 g/dL (ref 3.5–5.2)
Alkaline Phosphatase: 71 U/L (ref 39–117)
BUN: 14 mg/dL (ref 6–23)
CO2: 24 mEq/L (ref 19–32)
Calcium: 9.5 mg/dL (ref 8.4–10.5)
Chloride: 107 mEq/L (ref 96–112)
Creatinine, Ser: 1.17 mg/dL (ref 0.40–1.20)
GFR: 53.95 mL/min — ABNORMAL LOW (ref >60.00)
Glucose, Bld: 91 mg/dL (ref 70–99)
Potassium: 3.8 mEq/L (ref 3.5–5.1)
Sodium: 140 mEq/L (ref 135–145)
Total Bilirubin: 0.6 mg/dL (ref 0.2–1.2)
Total Protein: 7.2 g/dL (ref 6.0–8.3)

## 2020-12-07 LAB — IBC + FERRITIN
Ferritin: 18.5 ng/mL (ref 10.0–291.0)
Iron: 48 ug/dL (ref 42–145)
Saturation Ratios: 12.6 % — ABNORMAL LOW (ref 20.0–50.0)
Transferrin: 272 mg/dL (ref 212.0–360.0)

## 2020-12-07 LAB — VITAMIN D 25 HYDROXY (VIT D DEFICIENCY, FRACTURES): VITD: 20.92 ng/mL — ABNORMAL LOW (ref 30.00–100.00)

## 2020-12-07 LAB — CBC WITH DIFFERENTIAL/PLATELET
Basophils Absolute: 0 10*3/uL (ref 0.0–0.1)
Basophils Relative: 0.4 % (ref 0.0–3.0)
Eosinophils Absolute: 0.1 10*3/uL (ref 0.0–0.7)
Eosinophils Relative: 1.2 % (ref 0.0–5.0)
HCT: 42.7 % (ref 36.0–46.0)
Hemoglobin: 14.7 g/dL (ref 12.0–15.0)
Lymphocytes Relative: 30 % (ref 12.0–46.0)
Lymphs Abs: 2.4 10*3/uL (ref 0.7–4.0)
MCHC: 34.3 g/dL (ref 30.0–36.0)
MCV: 89.6 fl (ref 78.0–100.0)
Monocytes Absolute: 0.3 10*3/uL (ref 0.1–1.0)
Monocytes Relative: 4 % (ref 3.0–12.0)
Neutro Abs: 5.1 10*3/uL (ref 1.4–7.7)
Neutrophils Relative %: 64.4 % (ref 43.0–77.0)
Platelets: 322 10*3/uL (ref 150.0–400.0)
RBC: 4.77 Mil/uL (ref 3.87–5.11)
RDW: 13.5 % (ref 11.5–15.5)
WBC: 7.9 10*3/uL (ref 4.0–10.5)

## 2020-12-07 LAB — HEMOGLOBIN A1C: Hgb A1c MFr Bld: 5.5 % (ref 4.6–6.5)

## 2020-12-07 NOTE — Patient Instructions (Addendum)
You are not due for any vaccines currently (tetanus was done in 201   Congratulations on your success losing weight!   Mammogram ordered    Health Maintenance, Female Adopting a healthy lifestyle and getting preventive care are important in promoting health and wellness. Ask your health care provider about:  The right schedule for you to have regular tests and exams.  Things you can do on your own to prevent diseases and keep yourself healthy. What should I know about diet, weight, and exercise? Eat a healthy diet  Eat a diet that includes plenty of vegetables, fruits, low-fat dairy products, and lean protein.  Do not eat a lot of foods that are high in solid fats, added sugars, or sodium.   Maintain a healthy weight Body mass index (BMI) is used to identify weight problems. It estimates body fat based on height and weight. Your health care provider can help determine your BMI and help you achieve or maintain a healthy weight. Get regular exercise Get regular exercise. This is one of the most important things you can do for your health. Most adults should:  Exercise for at least 150 minutes each week. The exercise should increase your heart rate and make you sweat (moderate-intensity exercise).  Do strengthening exercises at least twice a week. This is in addition to the moderate-intensity exercise.  Spend less time sitting. Even light physical activity can be beneficial. Watch cholesterol and blood lipids Have your blood tested for lipids and cholesterol at 52 years of age, then have this test every 5 years. Have your cholesterol levels checked more often if:  Your lipid or cholesterol levels are high.  You are older than 52 years of age.  You are at high risk for heart disease. What should I know about cancer screening? Depending on your health history and family history, you may need to have cancer screening at various ages. This may include screening for:  Breast  cancer.  Cervical cancer.  Colorectal cancer.  Skin cancer.  Lung cancer. What should I know about heart disease, diabetes, and high blood pressure? Blood pressure and heart disease  High blood pressure causes heart disease and increases the risk of stroke. This is more likely to develop in people who have high blood pressure readings, are of African descent, or are overweight.  Have your blood pressure checked: ? Every 3-5 years if you are 65-42 years of age. ? Every year if you are 20 years old or older. Diabetes Have regular diabetes screenings. This checks your fasting blood sugar level. Have the screening done:  Once every three years after age 17 if you are at a normal weight and have a low risk for diabetes.  More often and at a younger age if you are overweight or have a high risk for diabetes. What should I know about preventing infection? Hepatitis B If you have a higher risk for hepatitis B, you should be screened for this virus. Talk with your health care provider to find out if you are at risk for hepatitis B infection. Hepatitis C Testing is recommended for:  Everyone born from [birth year] through 1967.  Anyone with known risk factors for hepatitis C. Sexually transmitted infections (STIs)  Get screened for STIs, including gonorrhea and chlamydia, if: ? You are sexually active and are younger than 51 years of age. ? You are older than 52 years of age and your health care provider tells you that you are at risk for this type  of infection. ? Your sexual activity has changed since you were last screened, and you are at increased risk for chlamydia or gonorrhea. Ask your health care provider if you are at risk.  Ask your health care provider about whether you are at high risk for HIV. Your health care provider may recommend a prescription medicine to help prevent HIV infection. If you choose to take medicine to prevent HIV, you should first get tested for HIV. You should  then be tested every 3 months for as long as you are taking the medicine. Pregnancy  If you are about to stop having your period (premenopausal) and you may become pregnant, seek counseling before you get pregnant.  Take 400 to 800 micrograms (mcg) of folic acid every day if you become pregnant.  Ask for birth control (contraception) if you want to prevent pregnancy. Osteoporosis and menopause Osteoporosis is a disease in which the bones lose minerals and strength with aging. This can result in bone fractures. If you are 49 years old or older, or if you are at risk for osteoporosis and fractures, ask your health care provider if you should:  Be screened for bone loss.  Take a calcium or vitamin D supplement to lower your risk of fractures.  Be given hormone replacement therapy (HRT) to treat symptoms of menopause. Follow these instructions at home: Lifestyle  Do not use any products that contain nicotine or tobacco, such as cigarettes, e-cigarettes, and chewing tobacco. If you need help quitting, ask your health care provider.  Do not use street drugs.  Do not share needles.  Ask your health care provider for help if you need support or information about quitting drugs. Alcohol use  Do not drink alcohol if: ? Your health care provider tells you not to drink. ? You are pregnant, may be pregnant, or are planning to become pregnant.  If you drink alcohol: ? Limit how much you use to 0-1 drink a day. ? Limit intake if you are breastfeeding.  Be aware of how much alcohol is in your drink. In the [place] one drink equals one 12 oz bottle of beer (355 mL), one 5 oz glass of wine (148 mL), or one 1 oz glass of hard liquor (44 mL). General instructions  Schedule regular health, dental, and eye exams.  Stay current with your vaccines.  Tell your health care provider if: ? You often feel depressed. ? You have ever been abused or do not feel safe at home. Summary  Adopting a  healthy lifestyle and getting preventive care are important in promoting health and wellness.  Follow your health care provider's instructions about healthy diet, exercising, and getting tested or screened for diseases.  Follow your health care provider's instructions on monitoring your cholesterol and blood pressure. This information is not intended to replace advice given to you by your health care provider. Make sure you discuss any questions you have with your health care provider. Document Revised: 07/30/2018 Document Reviewed: 07/30/2018 Elsevier Patient Education  2021 [employer].

## 2020-12-07 NOTE — Progress Notes (Signed)
Patient ID: Theresa Fox, female    DOB: 1971/08/10  Age: 50 y.o. MRN: [id number]  The patient is here for annual  Preventive examination and management of other chronic and acute problems.   The risk factors are reflected in the social history.  The roster of all physicians providing medical care to patient - is listed in the Snapshot section of the chart.  Activities of daily living:  The patient is 100% independent in all ADLs: dressing, toileting, feeding as well as independent mobility  Home safety : The patient has smoke detectors in the home. They wear seatbelts.  There are no firearms at home. There is no violence in the home.   There is no risks for hepatitis, STDs or HIV. There is no   history of blood transfusion. They have no travel history to infectious disease endemic areas of the world.  The patient has seen their dentist in the last six month. They have seen their eye doctor in the last year. They admit to slight hearing difficulty with regard to whispered voices and some television programs.  They have deferred audiologic testing in the last year.  They do not  have excessive sun exposure. Discussed the need for sun protection: hats, long sleeves and use of sunscreen if there is significant sun exposure.   Diet: the importance of a healthy diet is discussed. They do have a healthy diet.  The benefits of regular aerobic exercise were discussed. She walks 4 times per week ,  20 minutes.   Depression screen: there are no signs or vegative symptoms of depression- irritability, change in appetite, anhedonia, sadness/tearfullness.   The following portions of the patient's history were reviewed and updated as appropriate: allergies, current medications, past family history, past medical history,  past surgical history, past social history  and problem list.  Visual acuity was not assessed per patient preference since she has regular follow up with her ophthalmologist. Hearing and  body mass index were assessed and reviewed.   During the course of the visit the patient was educated and counseled about appropriate screening and preventive services including : fall prevention , diabetes screening, nutrition counseling, colorectal cancer screening, and recommended immunizations.    CC: The primary encounter diagnosis was Encounter for screening mammogram for malignant neoplasm of breast. Diagnoses of Screen for STD (sexually transmitted disease), Tachycardia, Multinodular thyroid, Microcytic anemia, Obesity (BMI 30-39.9), Vitamin D deficiency, Colon cancer screening, Encounter for routine adult health examination with abnormal findings, and Elevated blood pressure reading without diagnosis of hypertension were also pertinent to this visit.   1) obesity  Managed by the [facility] Weight loss management program  23 lbs thus far.  Monthly virtual visits with NP,  Virtual exercises offered too. But patient doing Boot camp on her own 3 days,  Cycling one day.  Right knee still a problem.  Received steroid  injection last year which helped for several months.  Did not tolerate meloxicam due to memory issues  , taking ibuprofen 400 mg once daily and modifying workout at boot camp.   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  Surgery in June.  Left eye much smaller.     Theresa Fox has a past medical history of Hypertension and Supraventricular tachycardia, nonsustained ([place]).   She has a past surgical history that includes Cesarean section classical.   Her family history includes Coronary artery disease in her father; Hypertension in her father and mother.She reports that she has  never smoked. She has never used smokeless tobacco. She reports current alcohol use of about 7.0 standard drinks of alcohol per week. She reports that she does not use drugs.  Outpatient Medications Prior to Visit  Medication Sig Dispense Refill  . ascorbic acid (VITAMIN C) 1000 MG tablet      . atenolol (TENORMIN) 25 MG tablet Take 1 tablet (25 mg total) by mouth daily. 60 tablet 1  . Biotin 10 MG CAPS     . cyanocobalamin 1000 MCG tablet     . Multiple Vitamin (MULTI-VITAMINS) TABS Take 1 tablet by mouth daily.    Marland Kitchen spironolactone (ALDACTONE) 25 MG tablet TAKE 1 TABLET BY MOUTH  DAILY AS NEEDED FOR FLUID  RETENTION 90 tablet 1  . topiramate (TOPAMAX) 50 MG tablet Take 50 mg by mouth daily.     No facility-administered medications prior to visit.    Review of Systems   Patient denies headache, fevers, malaise, unintentional weight loss, skin rash, eye pain, sinus congestion and sinus pain, sore throat, dysphagia,  hemoptysis , cough, dyspnea, wheezing, chest pain, palpitations, orthopnea, edema, abdominal pain, nausea, melena, diarrhea, constipation, flank pain, dysuria, hematuria, urinary  Frequency, nocturia, numbness, tingling, seizures,  Focal weakness, Loss of consciousness,  Tremor, insomnia, depression, anxiety, and suicidal ideation.      Objective:  BP (!) 122/92 (BP Location: Left Arm, Patient Position: Sitting, Cuff Size: Large)   Pulse 74   Temp 98.3 F (36.8 C) (Oral)   Resp 15   Ht 5\' 2"  (1.575 m)   Wt 181 lb 3.2 oz (82.2 kg)   SpO2 99%   BMI 33.14 kg/m   Physical Exam  General appearance: alert, cooperative and appears stated age Head: Normocephalic, without obvious abnormality, atraumatic Eyes: conjunctivae/corneas clear. PERRL, EOM's intact. Fundi benign. Ears: normal TM's and external ear canals both ears Nose: Nares normal. Septum midline. Mucosa normal. No drainage or sinus tenderness. Throat: lips, mucosa, and tongue normal; teeth and gums normal Neck: no adenopathy, no carotid bruit, no JVD, supple, symmetrical, trachea midline and thyroid not enlarged, symmetric, no tenderness/mass/nodules Lungs: clear to auscultation bilaterally Breasts: normal appearance, no masses or tenderness Heart: regular rate and rhythm, S1, S2 normal, no murmur,  click, rub or gallop Abdomen: soft, non-tender; bowel sounds normal; no masses,  no organomegaly Extremities: extremities normal, atraumatic, no cyanosis or edema Pulses: 2+ and symmetric Skin: Skin color, texture, turgor normal. No rashes or lesions Neurologic: Alert and oriented X 3, normal strength and tone. Normal symmetric reflexes. Normal coordination and gait.    Assessment & Plan:   Problem List Items Addressed This Visit      Unprioritized   Encounter for routine adult health examination with abnormal findings    age appropriate education and counseling updated, referrals for preventative services and immunizations addressed, dietary and smoking counseling addressed, most recent labs reviewed.  I have personally reviewed and have noted:  1) the patient's medical and social history 2) The pt's use of alcohol, tobacco, and illicit drugs 3) The patient's current medications and supplements 4) Functional ability including ADL's, fall risk, home safety risk, hearing and visual impairment 5) Diet and physical activities 6) Evidence for depression or mood disorder 7) The patient's height, weight, and BMI have been recorded in the chart  I have made referrals, and provided counseling and education based on review of the above      Obesity (BMI 30-39.9)    I have congratulated her in reduction of  BMI and encouraged  Continued weight loss with goal of 10% of body weigh over the next 6 months using a low glycemic index diet and regular exercise a minimum of 5 days per week.        Relevant Orders   Hemoglobin A1c (Completed)   Comprehensive metabolic panel (Completed)   Lipid panel (Completed)   Microcytic anemia   Relevant Orders   IBC + Ferritin (Completed)   CBC with Differential/Platelet (Completed)   Multinodular thyroid   Relevant Orders   Thyroid Panel With TSH (Completed)   Tachycardia   Elevated blood pressure reading without diagnosis of hypertension     Diastolic elevation noted today.   She has been asked to check her pressures at home and submit readings for evaluation. Renal function will be checked today  Lab Results  Component Value Date   CREATININE 1.17 12/07/2020          Other Visit Diagnoses    Encounter for screening mammogram for malignant neoplasm of breast    -  Primary   Relevant Orders   MM 3D SCREEN BREAST BILATERAL   Screen for STD (sexually transmitted disease)       Relevant Orders   HIV Antibody (routine testing w rflx) (Completed)   Vitamin D deficiency       Relevant Orders   VITAMIN D 25 Hydroxy (Vit-D Deficiency, Fractures) (Completed)   Colon cancer screening       Relevant Orders   Ambulatory referral to Gastroenterology      I am having Theresa Fox maintain her Multi-Vitamins, spironolactone, ascorbic acid, Biotin, cyanocobalamin, atenolol, and topiramate.  No orders of the defined types were placed in this encounter.   There are no discontinued medications.  Follow-up: Return in about 6 months (around 06/08/2021).   Crecencio Mc, MD

## 2020-12-08 ENCOUNTER — Other Ambulatory Visit: Payer: Self-pay | Admitting: Internal Medicine

## 2020-12-08 DIAGNOSIS — R944 Abnormal results of kidney function studies: Secondary | ICD-10-CM

## 2020-12-08 LAB — HIV ANTIBODY (ROUTINE TESTING W REFLEX): HIV 1&2 Ab, 4th Generation: NONREACTIVE

## 2020-12-08 LAB — THYROID PANEL WITH TSH
Free Thyroxine Index: 2.7 (ref 1.4–3.8)
T3 Uptake: 33 % (ref 22–35)
T4, Total: 8.1 ug/dL (ref 5.1–11.9)
TSH: 1.36 mIU/L

## 2020-12-08 MED ORDER — ERGOCALCIFEROL 1.25 MG (50000 UT) PO CAPS: 50000.0000 [IU] | ORAL_CAPSULE | ORAL | 0 refills | Status: DC

## 2020-12-09 DIAGNOSIS — R030 Elevated blood-pressure reading, without diagnosis of hypertension: Secondary | ICD-10-CM | POA: Insufficient documentation

## 2020-12-09 NOTE — Assessment & Plan Note (Signed)
I have congratulated her in reduction of   BMI and encouraged  Continued weight loss with goal of 10% of body weigh over the next 6 months using a low glycemic index diet and regular exercise a minimum of 5 days per week.    

## 2020-12-09 NOTE — Assessment & Plan Note (Signed)
Diastolic elevation noted today.   She has been asked to check her pressures at home and submit readings for evaluation. Renal function will be checked today  Lab Results  Component Value Date   CREATININE 1.17 12/07/2020

## 2020-12-09 NOTE — Assessment & Plan Note (Signed)

## 2020-12-19 ENCOUNTER — Other Ambulatory Visit: Payer: Self-pay | Admitting: Internal Medicine

## 2021-01-31 ENCOUNTER — Ambulatory Visit: Admit: 2021-01-31 | Payer: 59 | Admitting: Ophthalmology

## 2021-01-31 SURGERY — PHACOEMULSIFICATION, CATARACT, WITH IOL INSERTION
Anesthesia: Topical | Laterality: Right

## 2021-03-02 ENCOUNTER — Ambulatory Visit
Admission: RE | Admit: 2021-03-02 | Discharge: 2021-03-02 | Disposition: A | Discharge status: Home / Self Care | Payer: 59 | Source: Ambulatory Visit | Attending: Internal Medicine | Admitting: Internal Medicine

## 2021-03-02 ENCOUNTER — Other Ambulatory Visit: Payer: Self-pay

## 2021-03-02 DIAGNOSIS — Z1231 Encounter for screening mammogram for malignant neoplasm of breast: Secondary | ICD-10-CM

## 2021-03-09 ENCOUNTER — Telehealth (INDEPENDENT_AMBULATORY_CARE_PROVIDER_SITE_OTHER): Payer: Self-pay | Admitting: Gastroenterology

## 2021-03-09 DIAGNOSIS — Z1211 Encounter for screening for malignant neoplasm of colon: Secondary | ICD-10-CM

## 2021-03-09 MED ORDER — NA SULFATE-K SULFATE-MG SULF 17.5-3.13-1.6 GM/177ML PO SOLN: 1.0000 | Freq: Once | ORAL | 0 refills | Status: AC

## 2021-03-09 NOTE — Progress Notes (Signed)
Gastroenterology Pre-Procedure Review  Request Date: 04/03/21 Requesting Physician: Dr. Allen Norris  PATIENT REVIEW QUESTIONS: The patient responded to the following health history questions as indicated:    1. Are you having any GI issues? no 2. Do you have a personal history of Polyps? no 3. Do you have a family history of Colon Cancer or Polyps? yes (mother colon polyps) 4. Diabetes Mellitus? no 5. Joint replacements in the past 12 months?no 6. Major health problems in the past 3 months?no 7. Any artificial heart valves, MVP, or defibrillator?no    MEDICATIONS & ALLERGIES:    Patient reports the following regarding taking any anticoagulation/antiplatelet therapy:   Plavix, Coumadin, Eliquis, Xarelto, Lovenox, Pradaxa, Brilinta, or Effient? no Aspirin? no  Patient confirms/reports the following medications:  Current Outpatient Medications  Medication Sig Dispense Refill   ascorbic acid (VITAMIN C) 1000 MG tablet      atenolol (TENORMIN) 25 MG tablet Take 1 tablet (25 mg total) by mouth daily. 60 tablet 1   Biotin 10 MG CAPS      cyanocobalamin 1000 MCG tablet      ergocalciferol (DRISDOL) 1.25 MG (50000 UT) capsule Take 1 capsule (50,000 Units total) by mouth once a week. 4 capsule 0   Multiple Vitamin (MULTI-VITAMINS) TABS Take 1 tablet by mouth daily.     spironolactone (ALDACTONE) 25 MG tablet TAKE 1 TABLET BY MOUTH DAILY AS NEEDED FOR FLUID RETENTION 90 tablet 1   topiramate (TOPAMAX) 50 MG tablet Take 50 mg by mouth daily.     No current facility-administered medications for this visit.    Patient confirms/reports the following allergies:  No Known Allergies  No orders of the defined types were placed in this encounter.   AUTHORIZATION INFORMATION Primary Insurance: 1D#: Group #:  Secondary Insurance: 1D#: Group #:  SCHEDULE INFORMATION: Date: 04/03/21 Time: Location: [place]

## 2021-03-31 ENCOUNTER — Encounter: Payer: Self-pay | Admitting: Gastroenterology

## 2021-03-31 ENCOUNTER — Other Ambulatory Visit: Payer: Self-pay

## 2021-04-03 ENCOUNTER — Other Ambulatory Visit: Payer: Self-pay

## 2021-04-03 ENCOUNTER — Ambulatory Visit: Payer: 59 | Admitting: Anesthesiology

## 2021-04-03 ENCOUNTER — Ambulatory Visit
Admission: RE | Admit: 2021-04-03 | Discharge: 2021-04-03 | Disposition: A | Discharge status: Home / Self Care | Payer: 59 | Attending: Gastroenterology | Admitting: Gastroenterology

## 2021-04-03 ENCOUNTER — Encounter: Payer: Self-pay | Admitting: Gastroenterology

## 2021-04-03 ENCOUNTER — Encounter
Admission: RE | Disposition: A | Discharge status: Home / Self Care | Payer: Self-pay | Source: Home / Self Care | Attending: Gastroenterology

## 2021-04-03 DIAGNOSIS — Z1211 Encounter for screening for malignant neoplasm of colon: Secondary | ICD-10-CM | POA: Diagnosis not present

## 2021-04-03 DIAGNOSIS — Z79899 Other long term (current) drug therapy: Secondary | ICD-10-CM | POA: Insufficient documentation

## 2021-04-03 HISTORY — PX: COLONOSCOPY WITH PROPOFOL: SHX5780

## 2021-04-03 LAB — POCT PREGNANCY, URINE: Preg Test, Ur: NEGATIVE

## 2021-04-03 SURGERY — COLONOSCOPY WITH PROPOFOL
Anesthesia: General | Site: Rectum

## 2021-04-03 MED ORDER — ONDANSETRON HCL 4 MG/2ML IJ SOLN: 4.0000 mg | Freq: Once | INTRAMUSCULAR | Status: DC | PRN

## 2021-04-03 MED ORDER — LACTATED RINGERS IV SOLN: INTRAVENOUS | Status: DC

## 2021-04-03 MED ORDER — PROPOFOL 10 MG/ML IV BOLUS
INTRAVENOUS | Status: DC | PRN
  Administered 2021-04-03: 20 mg via INTRAVENOUS
  Administered 2021-04-03: 80 mg via INTRAVENOUS
  Administered 2021-04-03 (×4): 20 mg via INTRAVENOUS
  Administered 2021-04-03: 30 mg via INTRAVENOUS

## 2021-04-03 MED ORDER — LIDOCAINE HCL (CARDIAC) PF 100 MG/5ML IV SOSY
PREFILLED_SYRINGE | INTRAVENOUS | Status: DC | PRN
Start: 2021-04-03 — End: 2021-04-03
  Administered 2021-04-03: 50 mg via INTRAVENOUS

## 2021-04-03 MED ORDER — STERILE WATER FOR IRRIGATION IR SOLN: Status: DC | PRN

## 2021-04-03 MED ORDER — ACETAMINOPHEN 325 MG PO TABS: 325.0000 mg | ORAL_TABLET | ORAL | Status: DC | PRN

## 2021-04-03 MED ORDER — SODIUM CHLORIDE 0.9 % IV SOLN: INTRAVENOUS | Status: DC

## 2021-04-03 MED ORDER — ACETAMINOPHEN 160 MG/5ML PO SOLN: 325.0000 mg | ORAL | Status: DC | PRN

## 2021-04-03 SURGICAL SUPPLY — 6 items
GOWN CVR UNV OPN BCK APRN NK (MISCELLANEOUS) ×2 IMPLANT
GOWN ISOL THUMB LOOP REG UNIV (MISCELLANEOUS) ×4
KIT PRC NS LF DISP ENDO (KITS) ×1 IMPLANT
KIT PROCEDURE OLYMPUS (KITS) ×2
MANIFOLD NEPTUNE II (INSTRUMENTS) ×2 IMPLANT
WATER STERILE IRR 250ML POUR (IV SOLUTION) ×2 IMPLANT

## 2021-04-03 NOTE — Anesthesia Postprocedure Evaluation (Signed)
Anesthesia Post Note  Patient: Theresa Fox  Procedure(s) Performed: COLONOSCOPY WITH PROPOFOL (Rectum)     Patient location during evaluation: PACU Anesthesia Type: General Level of consciousness: awake and alert Pain management: pain level controlled Vital Signs Assessment: post-procedure vital signs reviewed and stable Respiratory status: spontaneous breathing, nonlabored ventilation, respiratory function stable and patient connected to nasal cannula oxygen Cardiovascular status: blood pressure returned to baseline and stable Postop Assessment: no apparent nausea or vomiting Anesthetic complications: no   No notable events documented.  Sinda Du

## 2021-04-03 NOTE — H&P (Signed)
Theresa Lame, MD [address] [place], [place] [address] Phone: [phone] Fax : [phone]  Primary Care Physician:  Crecencio Mc, MD Primary Gastroenterologist:  Dr. Allen Norris  Pre-Procedure History & Physical: HPI:  Theresa Fox is a 50 y.o. female is here for a screening colonoscopy.   Past Medical History:  Diagnosis Date   Hypertension    Supraventricular tachycardia, nonsustained ([place])     Past Surgical History:  Procedure Laterality Date   CESAREAN SECTION CLASSICAL      Prior to Admission medications   Medication Sig Start Date End Date Taking? Authorizing Provider  ascorbic acid (VITAMIN C) 1000 MG tablet  12/04/19  Yes [provider]  atenolol (TENORMIN) 25 MG tablet Take 1 tablet (25 mg total) by mouth daily. 08/10/20  Yes Crecencio Mc, MD  Biotin 10 MG CAPS  12/04/19  Yes [provider]  cyanocobalamin 1000 MCG tablet  12/04/19  Yes [provider]  ergocalciferol (DRISDOL) 1.25 MG (50000 UT) capsule Take 1 capsule (50,000 Units total) by mouth once a week. 12/08/20  Yes Crecencio Mc, MD  Multiple Vitamin (MULTI-VITAMINS) TABS Take 1 tablet by mouth daily.   Yes [provider]  spironolactone (ALDACTONE) 25 MG tablet TAKE 1 TABLET BY MOUTH DAILY AS NEEDED FOR FLUID RETENTION 12/19/20  Yes Crecencio Mc, MD  topiramate (TOPAMAX) 50 MG tablet Take 50 mg by mouth daily. 11/10/20  Yes [provider]    Allergies as of 03/09/2021   (No Known Allergies)    Family History  Problem Relation Age of Onset   Hypertension Mother    Hypertension Father    Coronary artery disease Father    Breast cancer Neg Hx     Social History   Socioeconomic History   Marital status: Single    Spouse name: Not on file   Number of children: Not on file   Years of education: Not on file   Highest education level: Not on file  Occupational History   Occupation: Therapist, sports    Employer: [employer]    Comment:  works from home   Tobacco Use   Smoking status: Never   Smokeless tobacco: Never  Vaping Use   Vaping Use: Never used  Substance and Sexual Activity   Alcohol use: Yes    Alcohol/week: 7.0 standard drinks    Types: 7 Standard drinks or equivalent per week   Drug use: No   Sexual activity: Yes    Partners: Male  Other Topics Concern   Not on file  Social History Narrative   Not on file   Social Determinants of Health   Financial Resource Strain: Not on file  Food Insecurity: Not on file  Transportation Needs: Not on file  Physical Activity: Not on file  Stress: Not on file  Social Connections: Not on file  Intimate Partner Violence: Not on file    Review of Systems: See HPI, otherwise negative ROS  Physical Exam: BP (!) 145/97   Pulse 72   Temp (!) 97.4 F (36.3 C) (Temporal)   Resp 20   Ht '5\' 2"'$  (1.575 m)   Wt 79.4 kg   LMP 03/27/2021   SpO2 97%   BMI 32.01 kg/m  General:   Alert,  pleasant and cooperative in NAD Head:  Normocephalic and atraumatic. Neck:  Supple; no masses or thyromegaly. Lungs:  Clear throughout to auscultation.    Heart:  Regular rate and rhythm. Abdomen:  Soft,  nontender and nondistended. Normal bowel sounds, without guarding, and without rebound.   Neurologic:  Alert and  oriented x4;  grossly normal neurologically.  Impression/Plan: Theresa Fox is now here to undergo a screening colonoscopy.  Risks, benefits, and alternatives regarding colonoscopy have been reviewed with the patient.  Questions have been answered.  All parties agreeable.

## 2021-04-03 NOTE — Anesthesia Preprocedure Evaluation (Signed)
Anesthesia Evaluation  Patient identified by MRN, date of birth, ID band Patient awake    Reviewed: Allergy & Precautions, NPO status , Patient's Chart, lab work & pertinent test results  Airway Mallampati: II  TM Distance: >3 FB Neck ROM: Full    Dental no notable dental hx.    Pulmonary neg pulmonary ROS,    Pulmonary exam normal breath sounds clear to auscultation       Cardiovascular Exercise Tolerance: Good hypertension, Normal cardiovascular exam Rhythm:Regular Rate:Normal     Neuro/Psych PSYCHIATRIC DISORDERS Anxiety negative neurological ROS     GI/Hepatic negative GI ROS, Neg liver ROS,   Endo/Other  negative endocrine ROS  Renal/GU negative Renal ROS  negative genitourinary   Musculoskeletal negative musculoskeletal ROS (+)   Abdominal (+) + obese,  Abdomen: soft.    Peds negative pediatric ROS (+)  Hematology  (+) anemia ,   Anesthesia Other Findings   Reproductive/Obstetrics negative OB ROS                             Anesthesia Physical Anesthesia Plan  ASA: 3  Anesthesia Plan: General   Post-op Pain Management:    Induction:   PONV Risk Score and Plan: 3 and Propofol infusion and Treatment may vary due to age or medical condition  Airway Management Planned: Natural Airway and Nasal Cannula  Additional Equipment:   Intra-op Plan:   Post-operative Plan:   Informed Consent: I have reviewed the patients History and Physical, chart, labs and discussed the procedure including the risks, benefits and alternatives for the proposed anesthesia with the patient or authorized representative who has indicated his/her understanding and acceptance.     Dental advisory given  Plan Discussed with: CRNA  Anesthesia Plan Comments:         Anesthesia Quick Evaluation  Patient Active Problem List   Diagnosis Date Noted  . Elevated blood pressure reading without  diagnosis of hypertension 12/09/2020  . Acute pain of right knee 12/28/2019  . Family history of thyroid cancer 12/15/2019  . Multinodular thyroid 12/04/2019  . Tachycardia 12/04/2019  . Microcytic anemia 09/29/2016  . Insomnia due to anxiety and fear 06/26/2014  . Secondary female infertility 06/23/2014  . Encounter for routine adult health examination with abnormal findings 03/29/2013  . Obesity (BMI 30-39.9) 03/29/2013  . Screening for cervical cancer 06/22/2011  . History of mammogram 06/22/2011    CBC Latest Ref Rng & Units 12/07/2020 10/01/2018 09/30/2017  WBC 4.0 - 10.5 K/uL 7.9 8.2 8.2  Hemoglobin 12.0 - 15.0 g/dL 14.7 13.1 11.7  Hematocrit 36.0 - 46.0 % 42.7 39.2 35.6  Platelets 150.0 - 400.0 K/uL 322.0 407.0(H) 387   BMP Latest Ref Rng & Units 12/07/2020 12/04/2019 10/01/2018  Glucose 70 - 99 mg/dL 91 97 97  BUN 6 - 23 mg/dL '14 9 8  '$ Creatinine 0.40 - 1.20 mg/dL 1.17 0.99 1.02  Sodium 135 - 145 mEq/L 140 139 138  Potassium 3.5 - 5.1 mEq/L 3.8 4.0 4.0  Chloride 96 - 112 mEq/L 107 105 102  CO2 19 - 32 mEq/L '24 25 27  '$ Calcium 8.4 - 10.5 mg/dL 9.5 9.1 9.4    Risks and benefits of anesthesia discussed at length, patient or surrogate demonstrates understanding. Appropriately NPO. Plan to proceed with anesthesia.  Champ Mungo, MD 04/03/21

## 2021-04-03 NOTE — Op Note (Signed)
[facility] Gastroenterology Patient Name: Theresa Fox Procedure Date: 04/03/2021 10:42 AM MRN: [id number] Account #: [id number] Date of Birth: March 11, 1971 Admit Type: Outpatient Age: 50 Room: [facility] OR ROOM 01 Gender: Female Note Status: Finalized Procedure:             Colonoscopy Indications:           Screening for colorectal malignant neoplasm Providers:             Lucilla Lame MD, MD Referring MD:          Deborra Medina, MD (Referring MD) Medicines:             Propofol per Anesthesia Complications:         No immediate complications. Procedure:             Pre-Anesthesia Assessment:                        - Prior to the procedure, a History and Physical was                         performed, and patient medications and allergies were                         reviewed. The patient's tolerance of previous                         anesthesia was also reviewed. The risks and benefits                         of the procedure and the sedation options and risks                         were discussed with the patient. All questions were                         answered, and informed consent was obtained. Prior                         Anticoagulants: The patient has taken no previous                         anticoagulant or antiplatelet agents. ASA Grade                         Assessment: II - A patient with mild systemic disease.                         After reviewing the risks and benefits, the patient                         was deemed in satisfactory condition to undergo the                         procedure.                        After obtaining informed consent, the colonoscope was  passed under direct vision. Throughout the procedure,                         the patient's blood pressure, pulse, and oxygen                         saturations were monitored continuously. The was                         introduced through the anus and  advanced to the the                         cecum, identified by appendiceal orifice and ileocecal                         valve. The colonoscopy was performed without                         difficulty. The patient tolerated the procedure well.                         The quality of the bowel preparation was excellent. Findings:      The perianal and digital rectal examinations were normal.      The entire examined colon appeared normal. Impression:            - The entire examined colon is normal.                        - No specimens collected. Recommendation:        - Discharge patient to home.                        - Resume previous diet.                        - Continue present medications.                        - Repeat colonoscopy in 10 years for screening                         purposes. Procedure Code(s):     --- Professional ---                        [phone], Colonoscopy, flexible; diagnostic, including                         collection of specimen(s) by brushing or washing, when                         performed (separate procedure) Diagnosis Code(s):     --- Professional ---                        Z12.11, Encounter for screening for malignant neoplasm                         of colon CPT copyright 2019 American Medical Association. All rights reserved. The codes documented in this report are preliminary and upon  coder review may  be revised to meet current compliance requirements. Lucilla Lame MD, MD 04/03/2021 11:02:16 AM This report has been signed electronically. Number of Addenda: 0 Note Initiated On: 04/03/2021 10:42 AM Scope Withdrawal Time: 0 hours 6 minutes 32 seconds  Total Procedure Duration: 0 hours 7 minutes 48 seconds  Estimated Blood Loss:  Estimated blood loss: none.      [facility]

## 2021-04-03 NOTE — Transfer of Care (Signed)
Immediate Anesthesia Transfer of Care Note  Patient: Theresa Fox  Procedure(s) Performed: COLONOSCOPY WITH PROPOFOL (Rectum)  Patient Location: PACU  Anesthesia Type: General  Level of Consciousness: awake, alert  and patient cooperative  Airway and Oxygen Therapy: Patient Spontanous Breathing and Patient connected to supplemental oxygen  Post-op Assessment: Post-op Vital signs reviewed, Patient's Cardiovascular Status Stable, Respiratory Function Stable, Patent Airway and No signs of Nausea or vomiting  Post-op Vital Signs: Reviewed and stable  Complications: No notable events documented.

## 2021-04-03 NOTE — Anesthesia Procedure Notes (Signed)
Date/Time: 04/03/2021 10:50 AM Performed by: Mayme Genta, CRNA Pre-anesthesia Checklist: Patient identified, Emergency Drugs available, Suction available, Timeout performed and Patient being monitored Patient Re-evaluated:Patient Re-evaluated prior to induction Oxygen Delivery Method: Nasal cannula Placement Confirmation: positive ETCO2

## 2021-04-04 ENCOUNTER — Encounter: Payer: Self-pay | Admitting: Gastroenterology

## 2021-04-21 MED ORDER — ATENOLOL 25 MG PO TABS: 25.0000 mg | ORAL_TABLET | Freq: Two times a day (BID) | ORAL | 1 refills | Status: DC

## 2021-04-21 MED ORDER — AMLODIPINE BESYLATE 2.5 MG PO TABS: 2.5000 mg | ORAL_TABLET | Freq: Every day | ORAL | 0 refills | Status: DC

## 2021-04-25 ENCOUNTER — Other Ambulatory Visit: Payer: Self-pay | Admitting: Internal Medicine

## 2021-06-08 ENCOUNTER — Encounter: Payer: Self-pay | Admitting: Internal Medicine

## 2021-06-08 ENCOUNTER — Ambulatory Visit (INDEPENDENT_AMBULATORY_CARE_PROVIDER_SITE_OTHER): Payer: 59 | Admitting: Internal Medicine

## 2021-06-08 ENCOUNTER — Other Ambulatory Visit: Payer: Self-pay

## 2021-06-08 VITALS — BP 122/70 | HR 78 | Temp 96.5°F | Ht 62.0 in | Wt 179.2 lb

## 2021-06-08 DIAGNOSIS — Z23 Encounter for immunization: Secondary | ICD-10-CM | POA: Diagnosis not present

## 2021-06-08 DIAGNOSIS — E042 Nontoxic multinodular goiter: Secondary | ICD-10-CM | POA: Diagnosis not present

## 2021-06-08 DIAGNOSIS — E559 Vitamin D deficiency, unspecified: Secondary | ICD-10-CM

## 2021-06-08 DIAGNOSIS — M25561 Pain in right knee: Secondary | ICD-10-CM

## 2021-06-08 DIAGNOSIS — R000 Tachycardia, unspecified: Secondary | ICD-10-CM

## 2021-06-08 DIAGNOSIS — R944 Abnormal results of kidney function studies: Secondary | ICD-10-CM

## 2021-06-08 DIAGNOSIS — G8929 Other chronic pain: Secondary | ICD-10-CM

## 2021-06-08 DIAGNOSIS — R7301 Impaired fasting glucose: Secondary | ICD-10-CM

## 2021-06-08 DIAGNOSIS — I10 Essential (primary) hypertension: Secondary | ICD-10-CM

## 2021-06-08 DIAGNOSIS — E669 Obesity, unspecified: Secondary | ICD-10-CM

## 2021-06-08 DIAGNOSIS — B359 Dermatophytosis, unspecified: Secondary | ICD-10-CM

## 2021-06-08 LAB — BASIC METABOLIC PANEL
BUN: 14 mg/dL (ref 6–23)
CO2: 26 mEq/L (ref 19–32)
Calcium: 9.4 mg/dL (ref 8.4–10.5)
Chloride: 105 mEq/L (ref 96–112)
Creatinine, Ser: 0.99 mg/dL (ref 0.40–1.20)
GFR: 65.69 mL/min (ref >60.00)
Glucose, Bld: 96 mg/dL (ref 70–99)
Potassium: 3.9 mEq/L (ref 3.5–5.1)
Sodium: 139 mEq/L (ref 135–145)

## 2021-06-08 LAB — VITAMIN D 25 HYDROXY (VIT D DEFICIENCY, FRACTURES): VITD: 22.04 ng/mL — ABNORMAL LOW (ref 30.00–100.00)

## 2021-06-08 LAB — TSH: TSH: 1.24 u[IU]/mL (ref 0.35–5.50)

## 2021-06-08 LAB — HEMOGLOBIN A1C: Hgb A1c MFr Bld: 5.7 % (ref 4.6–6.5)

## 2021-06-08 MED ORDER — PROPRANOLOL HCL 10 MG PO TABS: 10.0000 mg | ORAL_TABLET | Freq: Three times a day (TID) | ORAL | 0 refills | Status: DC

## 2021-06-08 MED ORDER — AMLODIPINE BESYLATE 5 MG PO TABS: 5.0000 mg | ORAL_TABLET | Freq: Every day | ORAL | Status: DC

## 2021-06-08 MED ORDER — CELECOXIB 200 MG PO CAPS: 200.0000 mg | ORAL_CAPSULE | Freq: Two times a day (BID) | ORAL | 1 refills | Status: DC

## 2021-06-08 NOTE — Patient Instructions (Addendum)
For your knee  Try celebrex 200 mg once  or twice daily instead of ibuprofen  Max out tylenol at 1000 mg twice daily   Please consider omeprazole 20 mg daily or every other  to protect stomach  from effects on NSAIDs on mucosal lining       try terbinafine cream available OTC  (Athlete's foot) for the ringworm .   Use twice daily for 3 weeks    Consider ozempic or Mounjaro for appetite suppression .  Both are covered by insurance   You can read about mounjaro and obtain the $25 copay card on their website: Mounjaro.com  Darcel Bayley is a medication that is taken as a weekly subcutaneous injection. It is not insulin.  It  causes your pancreas to increase its  own insulin secretion  And also slows down the emptying of your stomach,  So it decreases your appetite and helps you lose weight.  The dose for the first 4 weekly doses is 2.5 mg .  You may have mild nausea on the first or second day but this should resolve.  If not  ,  stop the medication.   As long as you are losing weight,  you can continue the dose you are on .  Only increase the dose to  5.0 mg  after 4 weeks if your weight has plateaued.  Let me know if you want to try it and I will send an rx to your pharmacy

## 2021-06-08 NOTE — Progress Notes (Addendum)
Subjective:  Patient ID: Theresa Fox, female    DOB: 10/22/1970  Age: 50 y.o. MRN: [id number]  CC: The primary encounter diagnosis was Vitamin D deficiency. Diagnoses of Decreased GFR, Impaired fasting glucose, Multinodular thyroid, Need for immunization against influenza, Essential hypertension, Obesity (BMI 30-39.9), Tachycardia, Ringworm, and Chronic pain of right knee were also pertinent to this visit.  HPI Theresa Fox presents for FOLLOW UP ON HYPERTENSION , obesity, and other issues Chief Complaint  Patient presents with   Follow-up    6 month follow up on hypertension    1) HTN: amlodipine increased by cardiology to 2.5 mg bid  advised to consider changing spironolactone to hctz for daily use and control og BP   2) bilateral knee pain:  right >> left.  Seeing Orthopedics.  PT recommended but deferred by patient bc she is doing [employer] 3 times per week (modified).  Dx OA .  Possible meniscus tear ,  will order MRI if PT fails.   3)  itchy lesion looks like ringworm left upper arm:  has been present for one week.  no young children  attended a workshop and works out at a gym   Outpatient Medications Prior to Visit  Medication Sig Dispense Refill   ascorbic acid (VITAMIN C) 1000 MG tablet      Biotin 10 MG CAPS      cyanocobalamin 1000 MCG tablet      ergocalciferol (DRISDOL) 1.25 MG (50000 UT) capsule Take 1 capsule (50,000 Units total) by mouth once a week. 4 capsule 0   LOMAIRA 8 MG TABS Take 1-2 tablets by mouth daily.     Multiple Vitamin (MULTI-VITAMINS) TABS Take 1 tablet by mouth daily.     PREVIDENT 5000 PLUS 1.1 % CREA dental cream SMARTSIG:Application By Mouth     spironolactone (ALDACTONE) 25 MG tablet TAKE 1 TABLET BY MOUTH DAILY AS NEEDED FOR FLUID RETENTION 90 tablet 1   topiramate (TOPAMAX) 50 MG tablet Take 50 mg by mouth daily.     amLODipine (NORVASC) 2.5 MG tablet Take 1 tablet (2.5 mg total) by mouth daily. 90 tablet 0   No facility-administered  medications prior to visit.    Review of Systems;  Patient denies headache, fevers, malaise, unintentional weight loss, skin rash, eye pain, sinus congestion and sinus pain, sore throat, dysphagia,  hemoptysis , cough, dyspnea, wheezing, chest pain, palpitations, orthopnea, edema, abdominal pain, nausea, melena, diarrhea, constipation, flank pain, dysuria, hematuria, urinary  Frequency, nocturia, numbness, tingling, seizures,  Focal weakness, Loss of consciousness,  Tremor, insomnia, depression, anxiety, and suicidal ideation.      Objective:  BP 122/70 (BP Location: Left Arm, Patient Position: Sitting, Cuff Size: Normal)   Pulse 78   Temp (!) 96.5 F (35.8 C) (Temporal)   Ht 5\' 2"  (1.575 m)   Wt 179 lb 3.2 oz (81.3 kg)   SpO2 98%   BMI 32.78 kg/m   BP Readings from Last 3 Encounters:  06/08/21 122/70  04/03/21 114/81  12/07/20 (!) 122/92    Wt Readings from Last 3 Encounters:  06/08/21 179 lb 3.2 oz (81.3 kg)  04/03/21 175 lb (79.4 kg)  12/07/20 181 lb 3.2 oz (82.2 kg)    General appearance: alert, cooperative and appears stated age Ears: normal TM's and external ear canals both ears Throat: lips, mucosa, and tongue normal; teeth and gums normal Neck: no adenopathy, no carotid bruit, supple, symmetrical, trachea midline and thyroid not enlarged, symmetric, no tenderness/mass/nodules Back: symmetric, no  curvature. ROM normal. No CVA tenderness. Lungs: clear to auscultation bilaterally Heart: regular rate and rhythm, S1, S2 normal, no murmur, click, rub or gallop Abdomen: soft, non-tender; bowel sounds normal; no masses,  no organomegaly Pulses: 2+ and symmetric Skin: Skin color, texture, turgor normal. No rashes or lesions Lymph nodes: Cervical, supraclavicular, and axillary nodes normal.  Lab Results  Component Value Date   HGBA1C 5.7 06/08/2021   HGBA1C 5.5 12/07/2020   HGBA1C 5.8 12/04/2019    Lab Results  Component Value Date   CREATININE 0.99 06/08/2021    CREATININE 1.17 12/07/2020   CREATININE 0.99 12/04/2019    Lab Results  Component Value Date   WBC 7.9 12/07/2020   HGB 14.7 12/07/2020   HCT 42.7 12/07/2020   PLT 322.0 12/07/2020   GLUCOSE 96 06/08/2021   CHOL 160 12/07/2020   TRIG 70.0 12/07/2020   HDL 62.40 12/07/2020   LDLDIRECT 72.0 09/27/2016   LDLCALC 83 12/07/2020   ALT 16 12/07/2020   AST 17 12/07/2020   NA 139 06/08/2021   K 3.9 06/08/2021   CL 105 06/08/2021   CREATININE 0.99 06/08/2021   BUN 14 06/08/2021   CO2 26 06/08/2021   TSH 1.24 06/08/2021   HGBA1C 5.7 06/08/2021   MICROALBUR 0.9 06/25/2014    No results found.  Assessment & Plan:   Problem List Items Addressed This Visit     Essential hypertension    Improving contol on 5 mg amlodipine daily . Reviewed use of spironolactone ,  Because of metabolic syndrome/PCOS      Relevant Medications   amLODipine (NORVASC) 5 MG tablet   propranolol (INDERAL) 10 MG tablet   Obesity (BMI 30-39.9)    I have congratulated her in her maintenance of her weight loss (210 in May 2021,  179 now) and encouraged  Continued weight loss with goal of 10% of body weigh over the next 6 months using a low glycemic index diet and regular exercise a minimum of 5 days per week.   May consider GLP 1 agonist as there are no known  Contraindications other than anticipated cost    Lab Results  Component Value Date   HGBA1C 5.7 06/08/2021         Relevant Medications   LOMAIRA 8 MG TABS   Multinodular thyroid   Relevant Medications   propranolol (INDERAL) 10 MG tablet   Other Relevant Orders   TSH (Completed)   Tachycardia    Using propranolol  prn      Chronic pain of right knee    Trial of celebrex once daily for OA pain,  Twice daily for acute strain  .  PPI recommended if using daily .      Ringworm    Left upper arm .  Topical antifungal prescribed.       Other Visit Diagnoses     Vitamin D deficiency    -  Primary   Relevant Orders   VITAMIN D 25  Hydroxy (Vit-D Deficiency, Fractures) (Completed)   Decreased GFR       Relevant Orders   Basic metabolic panel (Completed)   Impaired fasting glucose       Relevant Orders   Hemoglobin A1c (Completed)   Need for immunization against influenza       Relevant Orders   Flu Vaccine QUAD 20mo+IM (Fluarix, Fluzone & Alfiuria Quad PF) (Completed)       I have changed Mashell Mcburney's amLODipine. I am also having her start on  celecoxib and propranolol. Additionally, I am having her maintain her Multi-Vitamins, ascorbic acid, Biotin, cyanocobalamin, topiramate, ergocalciferol, spironolactone, PreviDent 5000 Plus, and Lomaira.  Meds ordered this encounter  Medications   amLODipine (NORVASC) 5 MG tablet    Sig: Take 1 tablet (5 mg total) by mouth daily.   celecoxib (CELEBREX) 200 MG capsule    Sig: Take 1 capsule (200 mg total) by mouth 2 (two) times daily.    Dispense:  60 capsule    Refill:  1   propranolol (INDERAL) 10 MG tablet    Sig: Take 1 tablet (10 mg total) by mouth 3 (three) times daily. As needed for rapid heart rate    Dispense:  30 tablet    Refill:  0    Medications Discontinued During This Encounter  Medication Reason   amLODipine (NORVASC) 2.5 MG tablet     Follow-up: Return in about 6 months (around 12/07/2021).   Crecencio Mc, MD

## 2021-06-10 DIAGNOSIS — E559 Vitamin D deficiency, unspecified: Secondary | ICD-10-CM | POA: Insufficient documentation

## 2021-06-10 DIAGNOSIS — B359 Dermatophytosis, unspecified: Secondary | ICD-10-CM | POA: Insufficient documentation

## 2021-06-10 MED ORDER — ERGOCALCIFEROL 1.25 MG (50000 UT) PO CAPS: 50000.0000 [IU] | ORAL_CAPSULE | ORAL | 0 refills | Status: DC

## 2021-06-10 NOTE — Assessment & Plan Note (Signed)
Mega dose weekly x 4 , then daily dose of 2000 ius.

## 2021-06-10 NOTE — Assessment & Plan Note (Signed)
Using propranolol  prn

## 2021-06-10 NOTE — Assessment & Plan Note (Addendum)
Improving contol on 5 mg amlodipine daily . Reviewed use of spironolactone ,  Because of metabolic syndrome/PCOS

## 2021-06-10 NOTE — Assessment & Plan Note (Signed)
Left upper arm .  Topical antifungal prescribed.

## 2021-06-10 NOTE — Assessment & Plan Note (Signed)
Trial of celebrex once daily for OA pain,  Twice daily for acute strain  .  PPI recommended if using daily .

## 2021-06-10 NOTE — Addendum Note (Signed)
Addended by: Crecencio Mc on: 06/10/2021 05:08 PM   Modules accepted: Orders

## 2021-06-10 NOTE — Assessment & Plan Note (Signed)
I have congratulated her in her maintenance of her weight loss (210 in May 2021,  179 now) and encouraged  Continued weight loss with goal of 10% of body weigh over the next 6 months using a low glycemic index diet and regular exercise a minimum of 5 days per week.   May consider GLP 1 agonist as there are no known  Contraindications other than anticipated cost    Lab Results  Component Value Date   HGBA1C 5.7 06/08/2021

## 2021-06-14 MED ORDER — TIRZEPATIDE 2.5 MG/0.5ML ~~LOC~~ SOAJ: 2.5000 mg | SUBCUTANEOUS | 2 refills | Status: DC

## 2021-06-14 NOTE — Telephone Encounter (Signed)
Advised patient to go to website

## 2021-06-23 MED ORDER — METFORMIN HCL ER 500 MG PO TB24: 500.0000 mg | ORAL_TABLET | Freq: Every day | ORAL | 1 refills | Status: DC

## 2021-09-11 ENCOUNTER — Other Ambulatory Visit: Payer: Self-pay | Admitting: Internal Medicine

## 2021-09-27 ENCOUNTER — Encounter: Payer: Self-pay | Admitting: Internal Medicine

## 2021-09-27 ENCOUNTER — Telehealth (INDEPENDENT_AMBULATORY_CARE_PROVIDER_SITE_OTHER): Payer: 59 | Admitting: Internal Medicine

## 2021-09-27 DIAGNOSIS — H1033 Unspecified acute conjunctivitis, bilateral: Secondary | ICD-10-CM

## 2021-09-27 DIAGNOSIS — E669 Obesity, unspecified: Secondary | ICD-10-CM | POA: Diagnosis not present

## 2021-09-27 DIAGNOSIS — H109 Unspecified conjunctivitis: Secondary | ICD-10-CM | POA: Insufficient documentation

## 2021-09-27 DIAGNOSIS — I10 Essential (primary) hypertension: Secondary | ICD-10-CM | POA: Diagnosis not present

## 2021-09-27 MED ORDER — OLOPATADINE HCL 0.1 % OP SOLN: 1.0000 [drp] | Freq: Two times a day (BID) | OPHTHALMIC | 12 refills | Status: DC

## 2021-09-27 MED ORDER — CARVEDILOL 3.125 MG PO TABS: 3.1250 mg | ORAL_TABLET | Freq: Two times a day (BID) | ORAL | 1 refills | Status: DC

## 2021-09-27 MED ORDER — CELECOXIB 200 MG PO CAPS: 200.0000 mg | ORAL_CAPSULE | Freq: Every day | ORAL | 1 refills | Status: DC

## 2021-09-27 MED ORDER — METFORMIN HCL ER 500 MG PO TB24: 500.0000 mg | ORAL_TABLET | Freq: Every day | ORAL | 1 refills | Status: DC

## 2021-09-27 NOTE — Progress Notes (Signed)
Virtual Visit via caregility  Note  This visit type was conducted due to national recommendations for restrictions regarding the COVID-19 pandemic (e.g. social distancing).  This format is felt to be most appropriate for this patient at this time.  All issues noted in this document were discussed and addressed.  No physical exam was performed (except for noted visual exam findings with Video Visits).   I connected withNAME@ on 09/27/21 at  4:00 PM EST by a video enabled telemedicine application and verified that I am speaking with the correct person using two identifiers. Location patient: home Location provider: work or home office Persons participating in the virtual visit: patient, provider  I discussed the limitations, risks, security and privacy concerns of performing an evaluation and management service by telephone and the availability of in person appointments. I also discussed with the patient that there may be a patient responsible charge related to this service. The patient expressed understanding and agreed to proceed.  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  Started taking clarinex yesterday with no change.  Concerned that the symptoms are caused by amlodipine based on a recent patient experience and her own research /review of case reports.  Dose was increased to 10 mg a few months ago by her cardiologist.  Also taking celebrex once daily,   spironolactone prn fluid retention,  metformin and topiramate for appetite suppression.  Denies vision changes , headaches , and eye pain.  Using propranolol for rapid heart rate    ROS: See pertinent positives and negatives per HPI.  Past Medical History:  Diagnosis Date   Hypertension    Supraventricular tachycardia, nonsustained ([place])     Past  Surgical History:  Procedure Laterality Date   CESAREAN SECTION CLASSICAL     COLONOSCOPY WITH PROPOFOL N/A 04/03/2021   Procedure: COLONOSCOPY WITH PROPOFOL;  Surgeon: Lucilla Lame, MD;  Location: [place];  Service: Endoscopy;  Laterality: N/A;    Family History  Problem Relation Age of Onset   Hypertension Mother    Hypertension Father    Coronary artery disease Father    Breast cancer Neg Hx     SOCIAL HX:  reports that she has never smoked. She has never used smokeless tobacco. She reports current alcohol use of about 7.0 standard drinks per week. She reports that she does not use drugs.    Current Outpatient Medications:    amLODipine (NORVASC) 10 MG tablet, Take 10 mg by mouth daily., Disp: , Rfl:    ascorbic acid (VITAMIN C) 1000 MG tablet, , Disp: , Rfl:    carvedilol (COREG) 3.125 MG tablet, Take 1 tablet (3.125 mg total) by mouth 2 (two) times daily with a meal., Disp: 180 tablet, Rfl: 1   olopatadine (PATANOL) 0.1 % ophthalmic solution, Place 1 drop into both eyes 2 (two) times daily., Disp: 5 mL, Rfl: 12   PREVIDENT 5000 PLUS 1.1 % CREA dental cream, SMARTSIG:Application By Mouth, Disp: , Rfl:    spironolactone (ALDACTONE) 25 MG tablet, TAKE 1 TABLET BY MOUTH DAILY AS NEEDED FOR FLUID RETENTION, Disp: 90 tablet, Rfl: 1   celecoxib (CELEBREX) 200 MG capsule, Take 1 capsule (200 mg total) by mouth daily., Disp: 90 capsule, Rfl: 1   metFORMIN (GLUCOPHAGE XR) 500 MG 24 hr tablet, Take 1 tablet (500 mg total)  by mouth daily with breakfast., Disp: 90 tablet, Rfl: 1  EXAM:  VITALS per patient if applicable:  GENERAL: alert, oriented, appears well and in no acute distress  HEENT: atraumatic, conjunctiva clear, , top eyelids bilaterally swollen, slight swelling of lower eyelids bilaterally  without matting of eyelids   NECK: normal movements of the head and neck  LUNGS: on inspection no signs of respiratory distress, breathing rate appears normal, no obvious gross  SOB, gasping or wheezing  CV: no obvious cyanosis  MS: moves all visible extremities without noticeable abnormality  PSYCH/NEURO: pleasant and cooperative, no obvious depression or anxiety, speech and thought processing grossly intact  ASSESSMENT AND PLAN:  Discussed the following assessment and plan:  Obesity (BMI 30-39.9)  Essential hypertension  Acute conjunctivitis of both eyes, unspecified acute conjunctivitis type  Obesity (BMI 30-39.9) No longer taking mounjaro. Or  metformin   Essential hypertension Stopping amlodipine and starting carvedilol given patient's concern that her swollen eye lids is secondary to use of 10 mg amlodipine.  Advised to increase carvedilol dose to 6. 25 mg bid  for readings > 140/80 after one week   Conjunctivitis Suspect allergy related due to  timing of symptoms with current seasonal blooming,; however she is concerned that this is a side effect of amlodipine.   patanol prescribed..  Continue antihistamine , suspend amlodipine     I discussed the assessment and treatment plan with the patient. The patient was provided an opportunity to ask questions and all were answered. The patient agreed with the plan and demonstrated an understanding of the instructions.   The patient was advised to call back or seek an in-person evaluation if the symptoms worsen or if the condition fails to improve as anticipated.   I spent 30 minutes dedicated to the care of this patient on the date of this encounter to include pre-visit review of her medical history,  Face-to-face time with the patient , and post visit ordering of testing and therapeutics.    Crecencio Mc, MD

## 2021-09-27 NOTE — Assessment & Plan Note (Addendum)
No longer taking mounjaro. Or  metformin

## 2021-09-27 NOTE — Patient Instructions (Signed)
Stop the amlodipine  Continue spironolactone  as needed   Start coreg at 3.25 mg twice daily  increase after 4 days if not 130/80 or less .  Goal 120/70 to 130/80  Continue a daily antihistamine   Adding patanol eye drops  if swelling and  itching  continue

## 2021-09-27 NOTE — Assessment & Plan Note (Addendum)
Suspect allergy related due to  timing of symptoms with current seasonal blooming,; however she is concerned that this is a side effect of amlodipine.   patanol prescribed..  Continue antihistamine , suspend amlodipine

## 2021-09-27 NOTE — Assessment & Plan Note (Signed)
Stopping amlodipine and starting carvedilol given patient's concern that her swollen eye lids is secondary to use of 10 mg amlodipine.  Advised to increase carvedilol dose to 6. 25 mg bid  for readings > 140/80 after one week

## 2021-12-07 ENCOUNTER — Encounter: Payer: Self-pay | Admitting: Internal Medicine

## 2021-12-07 ENCOUNTER — Ambulatory Visit (INDEPENDENT_AMBULATORY_CARE_PROVIDER_SITE_OTHER): Payer: 59 | Admitting: Internal Medicine

## 2021-12-07 VITALS — BP 122/86 | HR 71 | Temp 97.8°F | Ht 62.0 in | Wt 188.0 lb

## 2021-12-07 DIAGNOSIS — E785 Hyperlipidemia, unspecified: Secondary | ICD-10-CM

## 2021-12-07 DIAGNOSIS — R944 Abnormal results of kidney function studies: Secondary | ICD-10-CM

## 2021-12-07 DIAGNOSIS — I10 Essential (primary) hypertension: Secondary | ICD-10-CM

## 2021-12-07 DIAGNOSIS — E042 Nontoxic multinodular goiter: Secondary | ICD-10-CM

## 2021-12-07 DIAGNOSIS — R5383 Other fatigue: Secondary | ICD-10-CM

## 2021-12-07 DIAGNOSIS — R7301 Impaired fasting glucose: Secondary | ICD-10-CM

## 2021-12-07 DIAGNOSIS — E669 Obesity, unspecified: Secondary | ICD-10-CM

## 2021-12-07 DIAGNOSIS — I471 Supraventricular tachycardia: Secondary | ICD-10-CM

## 2021-12-07 LAB — CBC WITH DIFFERENTIAL/PLATELET
Basophils Absolute: 0 10*3/uL (ref 0.0–0.1)
Basophils Relative: 0.5 % (ref 0.0–3.0)
Eosinophils Absolute: 0.2 10*3/uL (ref 0.0–0.7)
Eosinophils Relative: 3.9 % (ref 0.0–5.0)
HCT: 43.3 % (ref 36.0–46.0)
Hemoglobin: 14.8 g/dL (ref 12.0–15.0)
Lymphocytes Relative: 40.1 % (ref 12.0–46.0)
Lymphs Abs: 1.9 10*3/uL (ref 0.7–4.0)
MCHC: 34 g/dL (ref 30.0–36.0)
MCV: 89.4 fl (ref 78.0–100.0)
Monocytes Absolute: 0.3 10*3/uL (ref 0.1–1.0)
Monocytes Relative: 5.8 % (ref 3.0–12.0)
Neutro Abs: 2.4 10*3/uL (ref 1.4–7.7)
Neutrophils Relative %: 49.7 % (ref 43.0–77.0)
Platelets: 348 10*3/uL (ref 150.0–400.0)
RBC: 4.85 Mil/uL (ref 3.87–5.11)
RDW: 12.6 % (ref 11.5–15.5)
WBC: 4.8 10*3/uL (ref 4.0–10.5)

## 2021-12-07 LAB — COMPREHENSIVE METABOLIC PANEL
ALT: 16 U/L (ref 0–35)
AST: 20 U/L (ref 0–37)
Albumin: 4.2 g/dL (ref 3.5–5.2)
Alkaline Phosphatase: 85 U/L (ref 39–117)
BUN: 16 mg/dL (ref 6–23)
CO2: 25 mEq/L (ref 19–32)
Calcium: 9.6 mg/dL (ref 8.4–10.5)
Chloride: 106 mEq/L (ref 96–112)
Creatinine, Ser: 1.17 mg/dL (ref 0.40–1.20)
GFR: 53.57 mL/min — ABNORMAL LOW (ref >60.00)
Glucose, Bld: 93 mg/dL (ref 70–99)
Potassium: 4.2 mEq/L (ref 3.5–5.1)
Sodium: 138 mEq/L (ref 135–145)
Total Bilirubin: 0.5 mg/dL (ref 0.2–1.2)
Total Protein: 7.2 g/dL (ref 6.0–8.3)

## 2021-12-07 LAB — MICROALBUMIN / CREATININE URINE RATIO
Creatinine,U: 279.6 mg/dL
Microalb Creat Ratio: 0.8 mg/g (ref 0.0–30.0)
Microalb, Ur: 2.2 mg/dL — ABNORMAL HIGH (ref 0.0–1.9)

## 2021-12-07 LAB — LIPID PANEL
Cholesterol: 161 mg/dL (ref 0–200)
HDL: 64.3 mg/dL (ref >39.00)
LDL Cholesterol: 85 mg/dL (ref 0–99)
NonHDL: 96.92
Total CHOL/HDL Ratio: 3
Triglycerides: 61 mg/dL (ref 0.0–149.0)
VLDL: 12.2 mg/dL (ref 0.0–40.0)

## 2021-12-07 LAB — LDL CHOLESTEROL, DIRECT: Direct LDL: 93 mg/dL

## 2021-12-07 LAB — TSH: TSH: 1.33 u[IU]/mL (ref 0.35–5.50)

## 2021-12-07 LAB — HEMOGLOBIN A1C: Hgb A1c MFr Bld: 5.6 % (ref 4.6–6.5)

## 2021-12-07 MED ORDER — PROPRANOLOL HCL 10 MG PO TABS
10.0000 mg | ORAL_TABLET | ORAL | 0 refills | Status: DC | PRN
Start: 2021-12-07 — End: 2022-04-09

## 2021-12-07 MED ORDER — LOSARTAN POTASSIUM 50 MG PO TABS: 50.0000 mg | ORAL_TABLET | Freq: Every day | ORAL | 0 refills | Status: DC

## 2021-12-07 NOTE — Assessment & Plan Note (Signed)
NOTED ON January 2023 HOLTER MONITOR DONE BY DR Corky Sox AT [facility].  (prior to use of carvedilol).  Prn propranolol  Given ,  Refer to Dr Oval Linsey for cardiology follow up per patient request ?

## 2021-12-07 NOTE — Patient Instructions (Addendum)
For your blood pressure regimen ? ?Reduce carvedilol to 1 tablet twice daily for one week,  then stop  ? ?Start losartan tomorrow at 1 tablet nightly  ;  if bp after 2 weeks is not 130/80 or less,  increase dose to 2 tablets (100 mg total)  ? ?Return in 1 week for potassium/cr check ? ?Referral to Dr Oval Linsey has been done!  ? ? ? ? ?

## 2021-12-07 NOTE — Assessment & Plan Note (Signed)
INTOLERANT OF AMLODIPINE DUE TO EYELID SWELLING.  Currently taking carvedilol 6.25 mg bid;  Changing to losartan 50 mg to start.  Reduce coreg to 3.125 mg bid for one week, then stop,  Increase losartan to 100 mg after 2 weeks if not 130/80 or less  Repeat bmet in one week.  Only usus spronolactone prn edema .  Advised to have bmet rechecked if she starts to use it daily  ?

## 2021-12-07 NOTE — Assessment & Plan Note (Signed)
Managed by outside provider in [place].  Using low dose phentermine for appetite suppression . Warned that Without carvedilol she may experience for runs of SVT  ?

## 2021-12-07 NOTE — Progress Notes (Signed)
? ?Subjective:  ?Patient ID: Theresa Fox, female    DOB: 07-10-1971  Age: 51 y.o. MRN: [id number] ? ?CC: The primary encounter diagnosis was Essential hypertension. Diagnoses of Multinodular thyroid, Impaired fasting glucose, Hyperlipidemia, unspecified hyperlipidemia type, Other fatigue, SVT (supraventricular tachycardia) ([place]), and Obesity (BMI 30-39.9) were also pertinent to this visit. ? ? ?This visit occurred during the SARS-CoV-2 public health emergency.  Safety protocols were in place, including screening questions prior to the visit, additional usage of staff PPE, and extensive cleaning of exam room while observing appropriate contact time as indicated for disinfecting solutions.   ? ?HPI ?Quentin Ore presents for follow up o n multiple isses ?Chief Complaint  ?Patient presents with  ? Follow-up  ?  6 month follow up   ? ?1) Eyelid swelling:  seen in Feb ,  amlodipine 10 mg dose  stopped per patient request ,  patanol for allergic conjunctivitis prescribed but not taken ? ?2) HTN:   was taking amlodipine 10 mg but developed eyelid edema . taking carvedilol.  No prior trial of ARB  ? ?3) Obesity:  weight gain of 5 lbs since Feb noted . Taking phentermine 8 mg presscrbed by outside provide in [place] .  Working 3 jobs for the past 2 years.    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    Exercising 3 days per week so she can't sleep  ? ?4) wants to change cardiologist to tiffany [place] [facility]  ? ?Outpatient Medications Prior to Visit  ?Medication Sig Dispense Refill  ? celecoxib (CELEBREX) 200 MG capsule Take 1 capsule (200 mg total) by mouth daily. 90 capsule 1  ? LOMAIRA 8 MG TABS Take 1-2 tablets by mouth daily.    ? metFORMIN (GLUCOPHAGE XR) 500 MG 24 hr tablet Take 1 tablet (500 mg total) by mouth daily with breakfast. 90 tablet 1  ? PREVIDENT 5000 PLUS 1.1 % CREA dental cream SMARTSIG:Application By Mouth    ?  spironolactone (ALDACTONE) 25 MG tablet TAKE 1 TABLET BY MOUTH DAILY AS NEEDED FOR FLUID RETENTION 90 tablet 1  ? topiramate (TOPAMAX) 50 MG tablet Take 50 mg by mouth 2 (two) times daily.    ? ascorbic acid (VITAMIN C) 1000 MG tablet     ? carvedilol (COREG) 3.125 MG tablet Take 1 tablet (3.125 mg total) by mouth 2 (two) times daily with a meal. 180 tablet 1  ? amLODipine (NORVASC) 10 MG tablet Take 10 mg by mouth daily.    ? olopatadine (PATANOL) 0.1 % ophthalmic solution Place 1 drop into both eyes 2 (two) times daily. 5 mL 12  ? ?No facility-administered medications prior to visit.  ? ? ?Review of Systems; ? ?Patient denies headache, fevers, malaise, unintentional weight loss, skin rash, eye pain, sinus congestion and sinus pain, sore throat, dysphagia,  hemoptysis , cough, dyspnea, wheezing, chest pain, palpitations, orthopnea, edema, abdominal pain, nausea, melena, diarrhea, constipation, flank pain, dysuria, hematuria, urinary  Frequency, nocturia, numbness, tingling, seizures,  Focal weakness, Loss of consciousness,  Tremor, insomnia, depression, anxiety, and suicidal ideation.   ? ? ? ?Objective:  ?BP 122/86 (BP Location: Left Arm, Patient Position: Sitting, Cuff Size: Normal)   Pulse 71   Temp 97.8 ?F (36.6 ?C) (Oral)   Ht $R'5\' 2"'cH$  (1.575 m)   Wt 188 lb (85.3 kg)   SpO2 99%   BMI 34.39 kg/m?  ? ?  BP Readings from Last 3 Encounters:  ?12/07/21 122/86  ?09/27/21 112/73  ?06/08/21 122/70  ? ? ?Wt Readings from Last 3 Encounters:  ?12/07/21 188 lb (85.3 kg)  ?09/27/21 183 lb (83 kg)  ?06/08/21 179 lb 3.2 oz (81.3 kg)  ? ? ?General appearance: alert, cooperative and appears stated age ?Ears: normal TM's and external ear canals both ears ?Throat: lips, mucosa, and tongue normal; teeth and gums normal ?Neck: no adenopathy, no carotid bruit, supple, symmetrical, trachea midline and thyroid not enlarged, symmetric, no tenderness/mass/nodules ?Back: symmetric, no curvature. ROM normal. No CVA tenderness. ?Lungs:  clear to auscultation bilaterally ?Heart: regular rate and rhythm, S1, S2 normal, no murmur, click, rub or gallop ?Abdomen: soft, non-tender; bowel sounds normal; no masses,  no organomegaly ?Pulses: 2+ and symmetric ?Skin: Skin color, texture, turgor normal. No rashes or lesions ?Lymph nodes: Cervical, supraclavicular, and axillary nodes normal. ? ?Lab Results  ?Component Value Date  ? HGBA1C 5.7 06/08/2021  ? HGBA1C 5.5 12/07/2020  ? HGBA1C 5.8 12/04/2019  ? ? ?Lab Results  ?Component Value Date  ? CREATININE 0.99 06/08/2021  ? CREATININE 1.17 12/07/2020  ? CREATININE 0.99 12/04/2019  ? ? ?Lab Results  ?Component Value Date  ? WBC 7.9 12/07/2020  ? HGB 14.7 12/07/2020  ? HCT 42.7 12/07/2020  ? PLT 322.0 12/07/2020  ? GLUCOSE 96 06/08/2021  ? CHOL 160 12/07/2020  ? TRIG 70.0 12/07/2020  ? HDL 62.40 12/07/2020  ? LDLDIRECT 72.0 09/27/2016  ? [place] 83 12/07/2020  ? ALT 16 12/07/2020  ? AST 17 12/07/2020  ? NA 139 06/08/2021  ? K 3.9 06/08/2021  ? CL 105 06/08/2021  ? CREATININE 0.99 06/08/2021  ? BUN 14 06/08/2021  ? CO2 26 06/08/2021  ? TSH 1.24 06/08/2021  ? HGBA1C 5.7 06/08/2021  ? MICROALBUR 0.9 06/25/2014  ? ? ?No results found. ? ?Assessment & Plan:  ? ?Problem List Items Addressed This Visit   ? ? Multinodular thyroid  ? Relevant Medications  ? propranolol (INDERAL) 10 MG tablet  ? Other Relevant Orders  ? TSH  ? Obesity (BMI 30-39.9)  ?  Managed by outside provider in [place].  Using low dose phentermine for appetite suppression . Warned that Without carvedilol she may experience for runs of SVT  ? ?  ?  ? Relevant Medications  ? LOMAIRA 8 MG TABS  ? Essential hypertension - Primary  ?  INTOLERANT OF AMLODIPINE DUE TO EYELID SWELLING.  Currently taking carvedilol 6.25 mg bid;  Changing to losartan 50 mg to start.  Reduce coreg to 3.125 mg bid for one week, then stop,  Increase losartan to 100 mg after 2 weeks if not 130/80 or less  Repeat bmet in one week.  Only usus spronolactone prn edema .  Advised to  have bmet rechecked if she starts to use it daily  ? ?  ?  ? Relevant Medications  ? losartan (COZAAR) 50 MG tablet  ? propranolol (INDERAL) 10 MG tablet  ? Other Relevant Orders  ? Comp Met (CMET)  ? Urine Microalbumin w/creat. ratio  ? Ambulatory referral to Cardiology  ? Basic metabolic panel  ? SVT (supraventricular tachycardia) ([place])  ?  NOTED ON January 2023 HOLTER MONITOR DONE BY DR Corky Sox AT [facility].  (prior to use of carvedilol).  Prn propranolol  Given ,  Refer to Dr Oval Linsey for cardiology follow up per patient request ? ?  ?  ? Relevant Medications  ? losartan (COZAAR) 50 MG tablet  ?  propranolol (INDERAL) 10 MG tablet  ? Other Relevant Orders  ? Ambulatory referral to Cardiology  ? ?Other Visit Diagnoses   ? ? Impaired fasting glucose      ? Relevant Orders  ? Comp Met (CMET)  ? HgB A1c  ? Hyperlipidemia, unspecified hyperlipidemia type      ? Relevant Medications  ? losartan (COZAAR) 50 MG tablet  ? propranolol (INDERAL) 10 MG tablet  ? Other Relevant Orders  ? Lipid Profile  ? Direct LDL  ? Other fatigue      ? Relevant Orders  ? CBC with Differential/Platelet  ? ?  ? ? ?I spent a total of 30 minutes with Jazarah on this visit , with review of February virtual visit. l  most recent  office visits with me and her cardioloist,  recent Holter Monitor,  EKG, imaging studies, Face-to-face time with the patient , and post visit ordering of testing and therapeutics.   ? ?Follow-up: Return in about 6 months (around 06/08/2022). ? ? ?Crecencio Mc, MD ?

## 2021-12-09 DIAGNOSIS — N1831 Chronic kidney disease, stage 3a: Secondary | ICD-10-CM | POA: Insufficient documentation

## 2021-12-09 DIAGNOSIS — R944 Abnormal results of kidney function studies: Secondary | ICD-10-CM | POA: Insufficient documentation

## 2021-12-09 NOTE — Assessment & Plan Note (Signed)
May be due to NSAID use and/or HTN>  neprhrology referral advised ? ?Lab Results  ?Component Value Date  ? CREATININE 1.17 12/07/2021  ? ? ?

## 2021-12-11 ENCOUNTER — Encounter: Payer: Self-pay | Admitting: Internal Medicine

## 2021-12-11 DIAGNOSIS — N1831 Chronic kidney disease, stage 3a: Secondary | ICD-10-CM

## 2022-01-30 ENCOUNTER — Encounter: Payer: Self-pay | Admitting: Internal Medicine

## 2022-01-30 DIAGNOSIS — I10 Essential (primary) hypertension: Secondary | ICD-10-CM

## 2022-02-07 ENCOUNTER — Other Ambulatory Visit (INDEPENDENT_AMBULATORY_CARE_PROVIDER_SITE_OTHER): Payer: 59

## 2022-02-07 DIAGNOSIS — I10 Essential (primary) hypertension: Secondary | ICD-10-CM

## 2022-02-07 LAB — BASIC METABOLIC PANEL
BUN: 12 mg/dL (ref 6–23)
CO2: 28 mEq/L (ref 19–32)
Calcium: 9.7 mg/dL (ref 8.4–10.5)
Chloride: 102 mEq/L (ref 96–112)
Creatinine, Ser: 0.99 mg/dL (ref 0.40–1.20)
GFR: 65.38 mL/min (ref >60.00)
Glucose, Bld: 88 mg/dL (ref 70–99)
Potassium: 3.9 mEq/L (ref 3.5–5.1)
Sodium: 137 mEq/L (ref 135–145)

## 2022-02-14 ENCOUNTER — Other Ambulatory Visit: Payer: Self-pay | Admitting: Internal Medicine

## 2022-03-07 ENCOUNTER — Ambulatory Visit (HOSPITAL_BASED_OUTPATIENT_CLINIC_OR_DEPARTMENT_OTHER): Payer: 59 | Admitting: Cardiovascular Disease

## 2022-03-07 NOTE — Progress Notes (Deleted)
Cardiology Office Note   Date:  03/07/2022   ID:  Theresa, Fox [birth year], MRN [id number]  PCP:  Theresa Mc, MD  Cardiologist:   Theresa Latch, MD   No chief complaint on file.    History of Present Illness: Theresa Fox is a 51 y.o. female with SVT, hypertension, CKD 3 A, and obesity who is being seen today for the evaluation of hypertension and SVT at the request of Theresa Mc, MD. she previously saw Dr. Corky Fox at [facility] 04/2021.  She was started on amlodipine for elevated blood pressure.  She is previously on atenolol as needed for tachycardia.  She has a longstanding history of sinus tachycardia.  She reported having a stress test about 10 years ago and taking atenolol as needed.  She saw her PCP 11/2021.  Blood pressure at that time was 122/86.  She was on amlodipine but it was discontinued due to eyelid swelling.  There was also some unpredictability due to allergic conjunctivitis.  She was taking carvedilol and spironolactone.  They added losartan.  She also had SVT on the monitor 08/2021.  She was given propranolol as needed and presents today to discuss her SVT.  [name] has been working with the weight management clinic from [facility].  Past Medical History:  Diagnosis Date   Hypertension    Supraventricular tachycardia, nonsustained [facility])     Past Surgical History:  Procedure Laterality Date   CESAREAN SECTION CLASSICAL     COLONOSCOPY WITH PROPOFOL N/A 04/03/2021   Procedure: COLONOSCOPY WITH PROPOFOL;  Surgeon: Theresa Lame, MD;  Location: [place];  Service: Endoscopy;  Laterality: N/A;     Current Outpatient Medications  Medication Sig Dispense Refill   celecoxib (CELEBREX) 200 MG capsule Take 1 capsule (200 mg total) by mouth daily. 90 capsule 1   LOMAIRA 8 MG TABS Take 1-2 tablets by mouth daily.     losartan (COZAAR) 50 MG tablet TAKE 1 TABLET(50 MG) BY MOUTH AT BEDTIME 90 tablet 1   metFORMIN (GLUCOPHAGE XR) 500 MG 24  hr tablet Take 1 tablet (500 mg total) by mouth daily with breakfast. 90 tablet 1   PREVIDENT 5000 PLUS 1.1 % CREA dental cream SMARTSIG:Application By Mouth     propranolol (INDERAL) 10 MG tablet Take 1 tablet (10 mg total) by mouth as needed. As needed for rapid heart rate 30 tablet 0   spironolactone (ALDACTONE) 25 MG tablet TAKE 1 TABLET BY MOUTH DAILY AS NEEDED FOR FLUID RETENTION 90 tablet 1   topiramate (TOPAMAX) 50 MG tablet Take 50 mg by mouth 2 (two) times daily.     No current facility-administered medications for this visit.    Allergies:   Amlodipine    Social History:  The patient  reports that she has never smoked. She has never used smokeless tobacco. She reports current alcohol use of about 7.0 standard drinks of alcohol per week. She reports that she does not use drugs.   Family History:  The patient's ***family history includes Coronary artery disease in her father; Hypertension in her father and mother.    ROS:  Please see the history of present illness.   Otherwise, review of systems are positive for {NONE DEFAULTED:18576}.   All other systems are reviewed and negative.    PHYSICAL EXAM: VS:  There were no vitals taken for this visit. , BMI There is no height or weight on file to calculate BMI. GENERAL:  Well appearing HEENT:  Pupils equal round and reactive, fundi not visualized, oral mucosa unremarkable NECK:  No jugular venous distention, waveform within normal limits, carotid upstroke brisk and symmetric, no bruits, no thyromegaly LYMPHATICS:  No cervical adenopathy LUNGS:  Clear to auscultation bilaterally HEART:  RRR.  PMI not displaced or sustained,S1 and S2 within normal limits, no S3, no S4, no clicks, no rubs, *** murmurs ABD:  Flat, positive bowel sounds normal in frequency in pitch, no bruits, no rebound, no guarding, no midline pulsatile mass, no hepatomegaly, no splenomegaly EXT:  2 plus pulses throughout, no edema, no cyanosis no clubbing SKIN:  No  rashes no nodules NEURO:  Cranial nerves II through XII grossly intact, motor grossly intact throughout PSYCH:  Cognitively intact, oriented to person place and time    EKG:  EKG {ACTION; IS/IS ZOX:09604540} ordered today. The ekg ordered today demonstrates ***   Recent Labs: 12/07/2021: ALT 16; Hemoglobin 14.8; Platelets 348.0; TSH 1.33 02/07/2022: BUN 12; Creatinine, Ser 0.99; Potassium 3.9; Sodium 137    Lipid Panel    Component Value Date/Time   CHOL 161 12/07/2021 0942   TRIG 61.0 12/07/2021 0942   HDL 64.30 12/07/2021 0942   CHOLHDL 3 12/07/2021 0942   VLDL 12.2 12/07/2021 0942   LDLCALC 85 12/07/2021 0942   LDLDIRECT 93.0 12/07/2021 0942      Wt Readings from Last 3 Encounters:  12/07/21 188 lb (85.3 kg)  09/27/21 183 lb (83 kg)  06/08/21 179 lb 3.2 oz (81.3 kg)      ASSESSMENT AND PLAN:  ***   Current medicines are reviewed at length with the patient today.  The patient {ACTIONS; HAS/DOES NOT HAVE:19233} concerns regarding medicines.  The following changes have been made:  {PLAN; NO CHANGE:13088:s}  Labs/ tests ordered today include: *** No orders of the defined types were placed in this encounter.    Disposition:   FU with ***     Signed, Theresa Lancour C. Oval Linsey, MD, [facility]  03/07/2022 10:06 AM    [place]

## 2022-04-09 ENCOUNTER — Encounter (HOSPITAL_BASED_OUTPATIENT_CLINIC_OR_DEPARTMENT_OTHER): Payer: Self-pay | Admitting: Cardiovascular Disease

## 2022-04-09 ENCOUNTER — Ambulatory Visit (INDEPENDENT_AMBULATORY_CARE_PROVIDER_SITE_OTHER): Payer: 59 | Admitting: Cardiovascular Disease

## 2022-04-09 DIAGNOSIS — R600 Localized edema: Secondary | ICD-10-CM | POA: Insufficient documentation

## 2022-04-09 DIAGNOSIS — I471 Supraventricular tachycardia: Secondary | ICD-10-CM

## 2022-04-09 DIAGNOSIS — I10 Essential (primary) hypertension: Secondary | ICD-10-CM | POA: Diagnosis not present

## 2022-04-09 HISTORY — DX: Localized edema: R60.0

## 2022-04-09 MED ORDER — METOPROLOL SUCCINATE ER 25 MG PO TB24: 25.0000 mg | ORAL_TABLET | Freq: Every day | ORAL | 3 refills | Status: DC

## 2022-04-09 NOTE — Patient Instructions (Addendum)
Medication Instructions:  START METOPROLOL SUC 25 MG DAILY   D/C PROPRANOLOL   *If you need a refill on your cardiac medications before your next appointment, please call your pharmacy*  Lab Work: NONE  Testing/Procedures: Your physician has requested that you have an echocardiogram. Echocardiography is a painless test that uses sound waves to create images of your heart. It provides your doctor with information about the size and shape of your heart and how well your heart's chambers and valves are working. This procedure takes approximately one hour. There are no restrictions for this procedure.   Follow-Up: At [facility], you and your health needs are our priority.  As part of our continuing mission to provide you with exceptional heart care, we have created designated Provider Care Teams.  These Care Teams include your primary Cardiologist (physician) and Advanced Practice Providers (APPs -  Physician Assistants and Nurse Practitioners) who all work together to provide you with the care you need, when you need it.  We recommend signing up for the patient portal called "MyChart".  Sign up information is provided on this After Visit Summary.  MyChart is used to connect with patients for Virtual Visits (Telemedicine).  Patients are able to view lab/test results, encounter notes, upcoming appointments, etc.  Non-urgent messages can be sent to your provider as well.   To learn more about what you can do with MyChart, go to [email or web address].    Your next appointment:   1-2  month(s)  The format for your next appointment:   In Person  Provider:   Laurann Montana, NP   DR [place] 6 MONTHS

## 2022-04-09 NOTE — Assessment & Plan Note (Signed)
Blood pressures have been better controlled at home, though her diastolic blood pressures do not seem to be consistently under 80.  Continue working on diet and exercise.  We are adding metoprolol as above.  Continue losartan.  She is spironolactone that she takes as needed for edema.  Could consider adding this daily for increased blood pressure control.

## 2022-04-09 NOTE — Progress Notes (Signed)
Cardiology Office Note:    Date:  04/09/2022   ID:  Theresa Fox, DOB 19-Sep-1970, MRN [id number]  PCP:  Crecencio Mc, MD   [place] Providers Cardiologist:  None     Referring MD: Crecencio Mc, MD   History of Present Illness:    Theresa Fox is a 51 y.o. female who is being seen today for the evaluation of hypertension, SVT, hyperlipidemia, here for hypertension at the request of Crecencio Mc, MD.  She saw her PCP 11/2021 and amlodopine was stopped due to eyelid swelling. She was started on losartan. They noted that she takes spironolactone as needed. She has a history of SVT noted on a Holter monitor 08/2021 and requested referral to cardiology.   Today, she states that for a couple of years she's experienced palpitations. These palpitations tend to make her feel lightheaded and short of breath, and they normally occur about 3 times per week, lasting several minutes to several hours. She states that it will usually recede if she sits still and relaxes. She denies chest pain, but states she has occasionally felt a "sting" during her palpitations. She notices an elevated heart rate at times, and states that her highest resting heart rate was 103. If it gets above 90 she takes propanolol, which helps.  She started having elevated BP about a year ago. She logs her BP at home and last week it was 120/84. She began noticing some shortness of breath about 3 weeks ago. She reports she will feel her heart beating hard and will feel short of breath while sitting at her desk. Additionally, she reports some LE edema which is usually better in the mornings.   In regards to her diet, she tries to watch her salt intake and cook at home when she can. She has one cup of caffeine per day, and states she is frequently stressed. She exercises 3 times per week for 60 minutes. Sometimes she notices an elevated heart rate and always slows down when this occurs. She denies any chest pain. No  headaches, syncope, orthopnea, or PND.   Past Medical History:  Diagnosis Date   Hypertension    Lower extremity edema 04/09/2022   Supraventricular tachycardia, nonsustained (HCC)     Past Surgical History:  Procedure Laterality Date   CESAREAN SECTION CLASSICAL     COLONOSCOPY WITH PROPOFOL N/A 04/03/2021   Procedure: COLONOSCOPY WITH PROPOFOL;  Surgeon: Lucilla Lame, MD;  Location: [facility];  Service: Endoscopy;  Laterality: N/A;    Current Medications: Current Meds  Medication Sig   celecoxib (CELEBREX) 200 MG capsule Take 1 capsule (200 mg total) by mouth daily.   LOMAIRA 8 MG TABS Take 1-2 tablets by mouth daily.   losartan (COZAAR) 50 MG tablet TAKE 1 TABLET(50 MG) BY MOUTH AT BEDTIME   metFORMIN (GLUCOPHAGE XR) 500 MG 24 hr tablet Take 1 tablet (500 mg total) by mouth daily with breakfast.   metoprolol succinate (TOPROL XL) 25 MG 24 hr tablet Take 1 tablet (25 mg total) by mouth daily.   PREVIDENT 5000 PLUS 1.1 % CREA dental cream SMARTSIG:Application By Mouth   propranolol (INDERAL) 10 MG tablet Take 1 tablet (10 mg total) by mouth as needed. As needed for rapid heart rate   spironolactone (ALDACTONE) 25 MG tablet TAKE 1 TABLET BY MOUTH DAILY AS NEEDED FOR FLUID RETENTION   topiramate (TOPAMAX) 50 MG tablet Take 50 mg by mouth 2 (two) times daily.  Allergies:   Amlodipine   Social History   Socioeconomic History   Marital status: Single    Spouse name: Not on file   Number of children: Not on file   Years of education: Not on file   Highest education level: Not on file  Occupational History   Occupation: RN    Employer: [employer]    Comment: works from home   Tobacco Use   Smoking status: Never   Smokeless tobacco: Never  Vaping Use   Vaping Use: Never used  Substance and Sexual Activity   Alcohol use: Yes    Alcohol/week: 7.0 standard drinks of alcohol    Types: 7 Standard drinks or equivalent per week   Drug use: No   Sexual  activity: Yes    Partners: Male  Other Topics Concern   Not on file  Social History Narrative   Not on file   Social Determinants of Health   Financial Resource Strain: Low Risk  (04/09/2022)   Overall Financial Resource Strain (CARDIA)    Difficulty of Paying Living Expenses: Not hard at all  Food Insecurity: No Food Insecurity (04/09/2022)   Hunger Vital Sign    Worried About Running Out of Food in the Last Year: Never true    Ran Out of Food in the Last Year: Never true  Transportation Needs: No Transportation Needs (04/09/2022)   PRAPARE - Hydrologist (Medical): No    Lack of Transportation (Non-Medical): No  Physical Activity: Sufficiently Active (04/09/2022)   Exercise Vital Sign    Days of Exercise per Week: 3 days    Minutes of Exercise per Session: 60 min  Stress: Not on file  Social Connections: Not on file     Family History: The patient's family history includes Coronary artery disease in her father; Heart attack in her father and paternal grandmother; Hypertension in her brother, father, and mother; Stroke in her father. There is no history of Breast cancer.  ROS:   Please see the history of present illness.    (+) Bilateral LE edema (+) Shortness of breath (+) Palpitations (+) Lightheadedness  (+) Stress   All other systems reviewed and are negative.  EKGs/Labs/Other Studies Reviewed:    The following studies were reviewed today: N/A  EKG:  EKG is personally reviewed.  04/09/22: Sinus rhythm. Rate 85 bpm. PACs  Recent Labs: 12/07/2021: ALT 16; Hemoglobin 14.8; Platelets 348.0; TSH 1.33 02/07/2022: BUN 12; Creatinine, Ser 0.99; Potassium 3.9; Sodium 137  Recent Lipid Panel    Component Value Date/Time   CHOL 161 12/07/2021 0942   TRIG 61.0 12/07/2021 0942   HDL 64.30 12/07/2021 0942   CHOLHDL 3 12/07/2021 0942   VLDL 12.2 12/07/2021 0942   LDLCALC 85 12/07/2021 0942   LDLDIRECT 93.0 12/07/2021 0942     Risk  Assessment/Calculations:       Physical Exam:    VS:  BP 138/86 (BP Location: Right Arm, Patient Position: Sitting, Cuff Size: Large)   Pulse 85   Ht '5\' 2"'$  (1.575 m)   Wt 199 lb 1.6 oz (90.3 kg)   BMI 36.42 kg/m     Wt Readings from Last 3 Encounters:  04/09/22 199 lb 1.6 oz (90.3 kg)  12/07/21 188 lb (85.3 kg)  09/27/21 183 lb (83 kg)   VS:  BP 138/86 (BP Location: Right Arm, Patient Position: Sitting, Cuff Size: Large)   Pulse 85   Ht '5\' 2"'$  (1.575 m)  Wt 199 lb 1.6 oz (90.3 kg)   BMI 36.42 kg/m  , BMI Body mass index is 36.42 kg/m. GENERAL:  Well appearing HEENT: Pupils equal round and reactive, fundi not visualized, oral mucosa unremarkable NECK:  + jugular venous distention 1 cm above clavicle at 45 degrees, waveform within normal limits, carotid upstroke brisk and symmetric, no bruits, no thyromegaly LUNGS:  Clear to auscultation bilaterally HEART:  RRR.  PMI not displaced or sustained,S1 and S2 within normal limits, no S3, no S4, no clicks, no rubs, no murmurs ABD:  Flat, positive bowel sounds normal in frequency in pitch, no bruits, no rebound, no guarding, no midline pulsatile mass, no hepatomegaly, no splenomegaly EXT:  2 plus pulses throughout, trace bilateral LE ankle edema, no cyanosis no clubbing SKIN:  No rashes no nodules NEURO:  Cranial nerves II through XII grossly intact, motor grossly intact throughout PSYCH:  Cognitively intact, oriented to person place and time     ASSESSMENT:    1. SVT (supraventricular tachycardia) ([place])   2. Essential hypertension   3. Lower extremity edema    PLAN:    In order of problems listed above:  SVT (supraventricular tachycardia) ([place]) She reports recurrent episodes of SVT that respond to propranolol.  Only one cup of coffee daily.  TSH and hgb wnl.  We discussed how topiramate and phentermine may be contributing to palpitations.  We will get a copy of her Holter monitor.  We will also stop her as needed propranolol and  add metoprolol succinate 25 mg daily.  We did discuss the possibility of ablation if she is truly having frequent episodes of SVT.  We will work with medical management for now.  Essential hypertension Blood pressures have been better controlled at home, though her diastolic blood pressures do not seem to be consistently under 80.  Continue working on diet and exercise.  We are adding metoprolol as above.  Continue losartan.  She is spironolactone that she takes as needed for edema.  Could consider adding this daily for increased blood pressure control.  Lower extremity edema She has experienced more lower extremity edema lately.  She also has mildly elevated JVP on exam.  We will get an echocardiogram to better assess.  Continue with blood pressure control and limiting sodium intake.  She asked is regularly and has no ischemic symptoms so we will not pursue an ischemic evaluation at this time.           Disposition: FU with APP in 1-2 months FU with Bryanda Mikel C. Oval Linsey, MD, [facility] in 6 months  Medication Adjustments/Labs and Tests Ordered: Current medicines are reviewed at length with the patient today.  Concerns regarding medicines are outlined above.  Orders Placed This Encounter  Procedures   EKG 12-Lead   ECHOCARDIOGRAM COMPLETE   Meds ordered this encounter  Medications   metoprolol succinate (TOPROL XL) 25 MG 24 hr tablet    Sig: Take 1 tablet (25 mg total) by mouth daily.    Dispense:  90 tablet    Refill:  3    D/C PROPRANOLOL    Patient Instructions  Medication Instructions:  START METOPROLOL SUC 25 MG DAILY   D/C PROPRANOLOL   *If you need a refill on your cardiac medications before your next appointment, please call your pharmacy*  Lab Work: NONE  Testing/Procedures: Your physician has requested that you have an echocardiogram. Echocardiography is a painless test that uses sound waves to create images of your heart. It provides  your doctor with information about  the size and shape of your heart and how well your heart's chambers and valves are working. This procedure takes approximately one hour. There are no restrictions for this procedure.   Follow-Up: At [facility], you and your health needs are our priority.  As part of our continuing mission to provide you with exceptional heart care, we have created designated Provider Care Teams.  These Care Teams include your primary Cardiologist (physician) and Advanced Practice Providers (APPs -  Physician Assistants and Nurse Practitioners) who all work together to provide you with the care you need, when you need it.  We recommend signing up for the patient portal called "MyChart".  Sign up information is provided on this After Visit Summary.  MyChart is used to connect with patients for Virtual Visits (Telemedicine).  Patients are able to view lab/test results, encounter notes, upcoming appointments, etc.  Non-urgent messages can be sent to your provider as well.   To learn more about what you can do with MyChart, go to [email or web address].    Your next appointment:   1 month(s)  The format for your next appointment:   In Person  Provider:   Laurann Montana, NP  DR [facility] IN 6 MONTHS          I,Breanna Adamick,acting as a scribe for Skeet Latch, MD.,have documented all relevant documentation on the behalf of Skeet Latch, MD,as directed by  Skeet Latch, MD while in the presence of Skeet Latch, MD.   I, [place] Oval Linsey, MD have reviewed all documentation for this visit.  The documentation of the exam, diagnosis, procedures, and orders on 04/09/2022 are all accurate and complete.   Signed, Skeet Latch, MD  04/09/2022 9:15 AM    [place]

## 2022-04-09 NOTE — Assessment & Plan Note (Addendum)
She has experienced more lower extremity edema lately.  She also has mildly elevated JVP on exam.  We will get an echocardiogram to better assess.  Continue with blood pressure control and limiting sodium intake.  She asked is regularly and has no ischemic symptoms so we will not pursue an ischemic evaluation at this time.

## 2022-04-09 NOTE — Assessment & Plan Note (Addendum)
She reports recurrent episodes of SVT that respond to propranolol.  Only one cup of coffee daily.  TSH and hgb wnl.  We discussed how topiramate and phentermine may be contributing to palpitations.  We will get a copy of her Holter monitor.  We will also stop her as needed propranolol and add metoprolol succinate 25 mg daily.  We did discuss the possibility of ablation if she is truly having frequent episodes of SVT.  We will work with medical management for now.

## 2022-04-29 ENCOUNTER — Encounter (HOSPITAL_BASED_OUTPATIENT_CLINIC_OR_DEPARTMENT_OTHER): Payer: Self-pay | Admitting: Cardiovascular Disease

## 2022-04-29 ENCOUNTER — Encounter: Payer: Self-pay | Admitting: Internal Medicine

## 2022-04-30 ENCOUNTER — Telehealth (HOSPITAL_BASED_OUTPATIENT_CLINIC_OR_DEPARTMENT_OTHER): Payer: Self-pay | Admitting: *Deleted

## 2022-04-30 DIAGNOSIS — I10 Essential (primary) hypertension: Secondary | ICD-10-CM

## 2022-04-30 NOTE — Telephone Encounter (Signed)
Note    Theresa Fox  P Cv Div Dwb Triage (supporting Skeet Latch, MD) 4 hours ago (8:23 AM)    9/7: 139/99 HR 76 post med headache >1 hour 1105: 146/96 79 chest discomfort  headache  mild shortness of breath 2 aspirin 2:15 pm 137/92 hr 83 1105 130/90 Hr78 Today 137/89 HR 98 prior to meds    Gerald Stabs, RN  Theresa [place] 5 hours ago (7:38 AM)   [place] Good morning,    Can you send me the records of your blood pressures please? Any other symptoms? Headaches?    Best,  Gerald Stabs, RN    Theresa Fox  P Cv Div Dwb Triage (supporting Skeet Latch, MD) 22 hours ago (2:32 PM)    Good afternoon just wanted to follow-up about my blood pressure.  My dystolic numbers have been elevated since Wednesday, pre and post medications with an  headache.    Thank you  Above message sent via mychart, will forward to Dr Oval Linsey for review

## 2022-04-30 NOTE — Telephone Encounter (Signed)
Please advise for med change

## 2022-05-01 MED ORDER — SPIRONOLACTONE 25 MG PO TABS: ORAL_TABLET | ORAL | 1 refills | Status: DC

## 2022-05-01 NOTE — Telephone Encounter (Signed)
Have her start spironolactone 12.5 (1/2 of 25 mg tablet) once daily.  She can still take extra 1/2 tablet if needed for edema

## 2022-05-07 ENCOUNTER — Ambulatory Visit (INDEPENDENT_AMBULATORY_CARE_PROVIDER_SITE_OTHER): Payer: 59

## 2022-05-07 DIAGNOSIS — R600 Localized edema: Secondary | ICD-10-CM

## 2022-05-07 DIAGNOSIS — I10 Essential (primary) hypertension: Secondary | ICD-10-CM | POA: Diagnosis not present

## 2022-05-07 DIAGNOSIS — I471 Supraventricular tachycardia: Secondary | ICD-10-CM | POA: Diagnosis not present

## 2022-05-07 LAB — ECHOCARDIOGRAM COMPLETE
Area-P 1/2: 5.54 cm2
MV M vel: 4.86 m/s
MV Peak grad: 94.5 mmHg
S' Lateral: 2.71 cm

## 2022-05-17 ENCOUNTER — Telehealth (HOSPITAL_BASED_OUTPATIENT_CLINIC_OR_DEPARTMENT_OTHER): Payer: Self-pay | Admitting: *Deleted

## 2022-05-17 NOTE — Telephone Encounter (Signed)
Dr Oval Linsey reviewed holter monitor from [place], no changes in current regimen

## 2022-05-21 MED ORDER — SPIRONOLACTONE 25 MG PO TABS
25.0000 mg | ORAL_TABLET | Freq: Every day | ORAL | 3 refills | Status: DC
Start: 2022-05-21 — End: 2022-12-21

## 2022-06-15 NOTE — Progress Notes (Deleted)
Office Visit    Patient Name: Theresa Fox Date of Encounter: 06/15/2022  PCP:  Crecencio Mc, MD   [place]  Cardiologist:  Skeet Latch, MD  Advanced Practice Provider:  No care team member to display Electrophysiologist:  None      Chief Complaint    Theresa Fox is a 51 y.o. female presents today for follow up after cardiac testing.   Past Medical History    Past Medical History:  Diagnosis Date   Hypertension    Lower extremity edema 04/09/2022   Supraventricular tachycardia, nonsustained (HCC)    Past Surgical History:  Procedure Laterality Date   CESAREAN SECTION CLASSICAL     COLONOSCOPY WITH PROPOFOL N/A 04/03/2021   Procedure: COLONOSCOPY WITH PROPOFOL;  Surgeon: Lucilla Lame, MD;  Location: [place];  Service: Endoscopy;  Laterality: N/A;    Allergies  Allergies  Allergen Reactions   Amlodipine     Other reaction(s): Angioedema (ALLERGY/intolerance)    History of Present Illness    Theresa Fox is a 51 y.o. female with a hx of HTN, SVT (by Holter monitor 08/2021), HLD, lower extremity edema last seen 04/09/22 by Dr. Oval Linsey.  Amlodipine previously discontinued due to eyelid swelling and started on Losartan by primary care. Established with Dr. Oval Linsey 04/09/22. Noted a few year history of palpitations and 3 week history of dyspnea as well as LE edema. PRN Propranolol was stopped and started on Metoprolol Succinate '25mg'$  daily. Echocardiogram ordered and performed 05/07/22 normal LVEF, mild MR.  Presents today for follow up. ***  EKGs/Labs/Other Studies Reviewed:   The following studies were reviewed today:  Echo 05/07/22  1. Left ventricular ejection fraction, by estimation, is 60 to 65%. The  left ventricle has normal function. The left ventricle has no regional  wall motion abnormalities. Left ventricular diastolic parameters were  normal.   2. Right ventricular systolic function is normal. The right  ventricular  size is normal. There is normal pulmonary artery systolic pressure. The  estimated right ventricular systolic pressure is 60.1 mmHg.   3. The mitral valve is grossly normal. Mild mitral valve regurgitation.  No evidence of mitral stenosis.   4. The aortic valve is tricuspid. Aortic valve regurgitation is not  visualized. No aortic stenosis is present.   5. The inferior vena cava is normal in size with greater than 50%  respiratory variability, suggesting right atrial pressure of 3 mmHg.   Conclusion(s)/Recommendation(s): Normal biventricular function without  evidence of hemodynamically significant valvular heart disease.   EKG:  No EKG today.  Recent Labs: 12/07/2021: ALT 16; Hemoglobin 14.8; Platelets 348.0; TSH 1.33 02/07/2022: BUN 12; Creatinine, Ser 0.99; Potassium 3.9; Sodium 137  Recent Lipid Panel    Component Value Date/Time   CHOL 161 12/07/2021 0942   TRIG 61.0 12/07/2021 0942   HDL 64.30 12/07/2021 0942   CHOLHDL 3 12/07/2021 0942   VLDL 12.2 12/07/2021 0942   LDLCALC 85 12/07/2021 0942   LDLDIRECT 93.0 12/07/2021 0942   Home Medications   No outpatient medications have been marked as taking for the 06/19/22 encounter (Appointment) with Loel Dubonnet, NP.     Review of Systems      All other systems reviewed and are otherwise negative except as noted above.  Physical Exam    VS:  There were no vitals taken for this visit. , BMI There is no height or weight on file to calculate BMI.  Wt Readings from Last 3 Encounters:  04/09/22 199 lb 1.6 oz (90.3 kg)  12/07/21 188 lb (85.3 kg)  09/27/21 183 lb (83 kg)     GEN: Well nourished, well developed, in no acute distress. HEENT: normal. Neck: Supple, no JVD, carotid bruits, or masses. Cardiac: ***RRR, no murmurs, rubs, or gallops. No clubbing, cyanosis, edema.  ***Radials/PT 2+ and equal bilaterally.  Respiratory:  ***Respirations regular and unlabored, clear to auscultation bilaterally. GI:  Soft, nontender, nondistended. MS: No deformity or atrophy. Skin: Warm and dry, no rash. Neuro:  Strength and sensation are intact. Psych: Normal affect.  Assessment & Plan    SVT - By prior Holter monitor. Since transition to Metoprolol ***  HTN -   LE edema -   No BP recorded.  {Refresh Note OR Click here to enter BP  :1}***      Disposition: Follow up {follow up:15908} with Skeet Latch, MD or APP.  Signed, Loel Dubonnet, NP 06/15/2022, 4:39 PM [facility]

## 2022-06-19 ENCOUNTER — Ambulatory Visit (HOSPITAL_BASED_OUTPATIENT_CLINIC_OR_DEPARTMENT_OTHER): Payer: 59 | Admitting: Family

## 2022-06-22 ENCOUNTER — Encounter: Payer: Self-pay | Admitting: Internal Medicine

## 2022-06-22 ENCOUNTER — Ambulatory Visit (INDEPENDENT_AMBULATORY_CARE_PROVIDER_SITE_OTHER): Payer: 59 | Admitting: Internal Medicine

## 2022-06-22 VITALS — BP 128/82 | HR 83 | Temp 98.3°F | Ht 62.0 in | Wt 205.6 lb

## 2022-06-22 DIAGNOSIS — R400 Somnolence: Secondary | ICD-10-CM

## 2022-06-22 DIAGNOSIS — R5383 Other fatigue: Secondary | ICD-10-CM

## 2022-06-22 DIAGNOSIS — R7301 Impaired fasting glucose: Secondary | ICD-10-CM

## 2022-06-22 DIAGNOSIS — Z23 Encounter for immunization: Secondary | ICD-10-CM | POA: Diagnosis not present

## 2022-06-22 DIAGNOSIS — I10 Essential (primary) hypertension: Secondary | ICD-10-CM

## 2022-06-22 DIAGNOSIS — E669 Obesity, unspecified: Secondary | ICD-10-CM

## 2022-06-22 DIAGNOSIS — R0683 Snoring: Secondary | ICD-10-CM

## 2022-06-22 MED ORDER — OZEMPIC (1 MG/DOSE) 4 MG/3ML ~~LOC~~ SOPN: 0.2500 mg | PEN_INJECTOR | SUBCUTANEOUS | 0 refills | Status: DC

## 2022-06-22 MED ORDER — HYDROCHLOROTHIAZIDE 25 MG PO TABS: 25.0000 mg | ORAL_TABLET | Freq: Every day | ORAL | 0 refills | Status: DC

## 2022-06-22 NOTE — Assessment & Plan Note (Signed)
Morning fatgiue.  Daytime somnolence,  reports of loud snoring by family,  worsening hypertension  and steadily rising hgb suggest OSA>  home sleep study ordered

## 2022-06-22 NOTE — Progress Notes (Signed)
Subjective:  Patient ID: Theresa Fox, female    DOB: 08-24-1970  Age: 51 y.o. MRN: [id number]  CC: The primary encounter diagnosis was Daytime somnolence. Diagnoses of Snoring, Essential hypertension, Other fatigue, Impaired fasting glucose, Need for immunization against influenza, and Obesity (BMI 30-39.9) were also pertinent to this visit.   HPI Theresa Fox presents for follow up on hypertension  Chief Complaint  Patient presents with   Follow-up    Follow up-Hypertension   1) not at gaol on losartan 50 and spironolactone 50,  still having edema. ECHO normal Sept . Dicussed changing 50 spironolactone   to hctz   2) morning fatigue:   wakes up tired.  No bed partner, gets up once a week at 4 to exercise.    Outpatient Medications Prior to Visit  Medication Sig Dispense Refill   LOMAIRA 8 MG TABS Take 1-2 tablets by mouth daily.     losartan (COZAAR) 50 MG tablet TAKE 1 TABLET(50 MG) BY MOUTH AT BEDTIME 90 tablet 1   metFORMIN (GLUCOPHAGE XR) 500 MG 24 hr tablet Take 1 tablet (500 mg total) by mouth daily with breakfast. 90 tablet 1   metoprolol succinate (TOPROL XL) 25 MG 24 hr tablet Take 1 tablet (25 mg total) by mouth daily. 90 tablet 3   spironolactone (ALDACTONE) 25 MG tablet Take 1 tablet (25 mg total) by mouth daily. Take 1/2 tablet daily and ok to use 1/2 tablet as needed for edema 90 tablet 3   topiramate (TOPAMAX) 50 MG tablet Take 50 mg by mouth 2 (two) times daily.     PREVIDENT 5000 PLUS 1.1 % CREA dental cream SMARTSIG:Application By Mouth (Patient not taking: Reported on 06/22/2022)     celecoxib (CELEBREX) 200 MG capsule Take 1 capsule (200 mg total) by mouth daily. 90 capsule 1   No facility-administered medications prior to visit.    Review of Systems;  Patient denies headache, fevers, malaise, unintentional weight loss, skin rash, eye pain, sinus congestion and sinus pain, sore throat, dysphagia,  hemoptysis , cough, dyspnea, wheezing, chest pain,  palpitations, orthopnea, edema, abdominal pain, nausea, melena, diarrhea, constipation, flank pain, dysuria, hematuria, urinary  Frequency, nocturia, numbness, tingling, seizures,  Focal weakness, Loss of consciousness,  Tremor, insomnia, depression, anxiety, and suicidal ideation.      Objective:  BP 128/82 (BP Location: Left Arm, Patient Position: Sitting, Cuff Size: Normal)   Pulse 83   Temp 98.3 F (36.8 C) (Oral)   Ht '5\' 2"'$  (1.575 m)   Wt 205 lb 9.6 oz (93.3 kg)   SpO2 98%   BMI 37.60 kg/m   BP Readings from Last 3 Encounters:  06/22/22 128/82  04/09/22 138/86  12/07/21 122/86    Wt Readings from Last 3 Encounters:  06/22/22 205 lb 9.6 oz (93.3 kg)  04/09/22 199 lb 1.6 oz (90.3 kg)  12/07/21 188 lb (85.3 kg)    General appearance: alert, cooperative and appears stated age Ears: normal TM's and external ear canals both ears Throat: lips, mucosa, and tongue normal; teeth and gums normal Neck: no adenopathy, no carotid bruit, supple, symmetrical, trachea midline and thyroid not enlarged, symmetric, no tenderness/mass/nodules Back: symmetric, no curvature. ROM normal. No CVA tenderness. Lungs: clear to auscultation bilaterally Heart: regular rate and rhythm, S1, S2 normal, no murmur, click, rub or gallop Abdomen: soft, non-tender; bowel sounds normal; no masses,  no organomegaly Pulses: 2+ and symmetric Skin: Skin color, texture, turgor normal. No rashes or lesions Lymph nodes: Cervical, supraclavicular, and  axillary nodes normal. Neuro:  awake and interactive with normal mood and affect. Higher cortical functions are normal. Speech is clear without word-finding difficulty or dysarthria. Extraocular movements are intact. Visual fields of both eyes are grossly intact. Sensation to light touch is grossly intact bilaterally of upper and lower extremities. Motor examination shows 4+/5 symmetric hand grip and upper extremity and 5/5 lower extremity strength. There is no pronation or  drift. Gait is non-ataxic   Lab Results  Component Value Date   HGBA1C 5.6 12/07/2021   HGBA1C 5.7 06/08/2021   HGBA1C 5.5 12/07/2020    Lab Results  Component Value Date   CREATININE 0.99 02/07/2022   CREATININE 1.17 12/07/2021   CREATININE 0.99 06/08/2021    Lab Results  Component Value Date   WBC 4.8 12/07/2021   HGB 14.8 12/07/2021   HCT 43.3 12/07/2021   PLT 348.0 12/07/2021   GLUCOSE 88 02/07/2022   CHOL 161 12/07/2021   TRIG 61.0 12/07/2021   HDL 64.30 12/07/2021   LDLDIRECT 93.0 12/07/2021   LDLCALC 85 12/07/2021   ALT 16 12/07/2021   AST 20 12/07/2021   NA 137 02/07/2022   K 3.9 02/07/2022   CL 102 02/07/2022   CREATININE 0.99 02/07/2022   BUN 12 02/07/2022   CO2 28 02/07/2022   TSH 1.33 12/07/2021   HGBA1C 5.6 12/07/2021   MICROALBUR 2.2 (H) 12/07/2021    No results found.  Assessment & Plan:   Problem List Items Addressed This Visit     Essential hypertension    Not at goal on maximal dose of losartan and metoprolol..  history of angioedema to amlodipine.  Using spironolactone prn edema (normal ECHO).  Adding hctz 25 mg daily.  Return in one month for labs       Relevant Medications   hydrochlorothiazide (HYDRODIURIL) 25 MG tablet   Other Relevant Orders   Lipid Panel w/reflex Direct LDL   Comprehensive metabolic panel   Obesity (BMI 30-39.9)     She has trouble suppressing appetite  AND HAS NOT LOST WEIGHT WITH USE OF METFORMIN.  She is  requesting 26.  Patient has no contraindications to using this medication.  The risks  and benefits were reviewed, and instructions on titration of dose were outlined.  .counselling about diet and exercise given.         Relevant Medications   Semaglutide, 1 MG/DOSE, (OZEMPIC, 1 MG/DOSE,) 4 MG/3ML SOPN   Fatigue    Morning fatgiue.  Daytime somnolence,  reports of loud snoring by family,  worsening hypertension  and steadily rising hgb suggest OSA>  home sleep study ordered       Relevant Orders   CBC  with Differential/Platelet   Other Visit Diagnoses     Daytime somnolence    -  Primary   Relevant Orders   Home sleep test   Snoring       Relevant Orders   Home sleep test   Impaired fasting glucose       Relevant Orders   Hemoglobin A1c   Need for immunization against influenza       Relevant Orders   Flu Vaccine QUAD 22moIM (Fluarix, Fluzone & Alfiuria Quad PF) (Completed)       I spent a total of 38 minutes with this patient in a face to face visit on the date of this encounter reviewing the last office visit with me , her  recent visit with cardiology, which included reviewing her ECHO,,  patient's diet  and exercise habits, home blood pressure readings,   counselling on weight management  ,   and post visit ordering of testing and therapeutics.    Follow-up: Return in about 4 weeks (around 07/20/2022).   Crecencio Mc, MD

## 2022-06-22 NOTE — Patient Instructions (Addendum)
I am recommending a daily dose of HCTZ instead of spironolactone.  Take In the morning (diuretic)  We will try to obtain PA for Ozempic  If the medication gets approved  the starting dose is 0.25 MG  (MUCH LOWER THAN WHAT THE PEN SAYS) . YOU WILL NEED TO COUNT 19 CLICKS TO DELIVER THIS DOSE FROM THE PEN  SLEEP STUDY (HOME TEST) ORDERED   RETURN IN 3-4 WEEKS FOR FASTING LABS

## 2022-06-22 NOTE — Assessment & Plan Note (Addendum)
She has trouble suppressing appetite  AND HAS NOT LOST WEIGHT WITH USE OF METFORMIN.  She is  requesting 80.  Patient has no contraindications to using this medication.  The risks  and benefits were reviewed, and instructions on titration of dose were outlined.  .counselling about diet and exercise given.

## 2022-06-22 NOTE — Assessment & Plan Note (Addendum)
Not at goal on maximal dose of losartan and metoprolol..  history of angioedema to amlodipine.  Using spironolactone prn edema (normal ECHO).  Adding hctz 25 mg daily.  Return in one month for labs

## 2022-06-27 ENCOUNTER — Encounter: Payer: Self-pay | Admitting: Internal Medicine

## 2022-06-27 DIAGNOSIS — U071 COVID-19: Secondary | ICD-10-CM | POA: Insufficient documentation

## 2022-06-27 MED ORDER — MOLNUPIRAVIR EUA 200MG CAPSULE: 4.0000 | ORAL_CAPSULE | Freq: Two times a day (BID) | ORAL | 0 refills | Status: AC

## 2022-06-28 ENCOUNTER — Other Ambulatory Visit: Payer: Self-pay | Admitting: *Deleted

## 2022-06-28 MED ORDER — HYDROCHLOROTHIAZIDE 25 MG PO TABS: 25.0000 mg | ORAL_TABLET | Freq: Every day | ORAL | 1 refills | Status: DC

## 2022-07-20 ENCOUNTER — Other Ambulatory Visit (INDEPENDENT_AMBULATORY_CARE_PROVIDER_SITE_OTHER): Payer: 59

## 2022-07-20 DIAGNOSIS — R7301 Impaired fasting glucose: Secondary | ICD-10-CM | POA: Diagnosis not present

## 2022-07-20 DIAGNOSIS — I10 Essential (primary) hypertension: Secondary | ICD-10-CM | POA: Diagnosis not present

## 2022-07-20 DIAGNOSIS — R5383 Other fatigue: Secondary | ICD-10-CM

## 2022-07-20 LAB — COMPREHENSIVE METABOLIC PANEL
ALT: 19 U/L (ref 0–35)
AST: 18 U/L (ref 0–37)
Albumin: 4.1 g/dL (ref 3.5–5.2)
Alkaline Phosphatase: 75 U/L (ref 39–117)
BUN: 13 mg/dL (ref 6–23)
CO2: 32 mEq/L (ref 19–32)
Calcium: 9.5 mg/dL (ref 8.4–10.5)
Chloride: 100 mEq/L (ref 96–112)
Creatinine, Ser: 0.99 mg/dL (ref 0.40–1.20)
GFR: 65.18 mL/min (ref >60.00)
Glucose, Bld: 94 mg/dL (ref 70–99)
Potassium: 3.3 mEq/L — ABNORMAL LOW (ref 3.5–5.1)
Sodium: 140 mEq/L (ref 135–145)
Total Bilirubin: 0.7 mg/dL (ref 0.2–1.2)
Total Protein: 6.8 g/dL (ref 6.0–8.3)

## 2022-07-20 LAB — CBC WITH DIFFERENTIAL/PLATELET
Basophils Absolute: 0 10*3/uL (ref 0.0–0.1)
Basophils Relative: 0.3 % (ref 0.0–3.0)
Eosinophils Absolute: 0.2 10*3/uL (ref 0.0–0.7)
Eosinophils Relative: 2.6 % (ref 0.0–5.0)
HCT: 38.5 % (ref 36.0–46.0)
Hemoglobin: 13.5 g/dL (ref 12.0–15.0)
Lymphocytes Relative: 27.2 % (ref 12.0–46.0)
Lymphs Abs: 2 10*3/uL (ref 0.7–4.0)
MCHC: 35 g/dL (ref 30.0–36.0)
MCV: 88.1 fl (ref 78.0–100.0)
Monocytes Absolute: 0.6 10*3/uL (ref 0.1–1.0)
Monocytes Relative: 7.5 % (ref 3.0–12.0)
Neutro Abs: 4.6 10*3/uL (ref 1.4–7.7)
Neutrophils Relative %: 62.4 % (ref 43.0–77.0)
Platelets: 338 10*3/uL (ref 150.0–400.0)
RBC: 4.37 Mil/uL (ref 3.87–5.11)
RDW: 13.2 % (ref 11.5–15.5)
WBC: 7.4 10*3/uL (ref 4.0–10.5)

## 2022-07-20 LAB — HEMOGLOBIN A1C: Hgb A1c MFr Bld: 5.9 % (ref 4.6–6.5)

## 2022-07-21 LAB — LIPID PANEL W/REFLEX DIRECT LDL
Cholesterol: 173 mg/dL (ref <200)
HDL: 56 mg/dL (ref >=50)
LDL Cholesterol (Calc): 100 mg/dL (calc) — ABNORMAL HIGH
Non-HDL Cholesterol (Calc): 117 mg/dL (calc) (ref <130)
Total CHOL/HDL Ratio: 3.1 (calc) (ref <5.0)
Triglycerides: 81 mg/dL (ref <150)

## 2022-07-22 ENCOUNTER — Encounter: Payer: Self-pay | Admitting: Internal Medicine

## 2022-07-22 DIAGNOSIS — I10 Essential (primary) hypertension: Secondary | ICD-10-CM

## 2022-08-10 ENCOUNTER — Other Ambulatory Visit (INDEPENDENT_AMBULATORY_CARE_PROVIDER_SITE_OTHER): Payer: 59

## 2022-08-10 DIAGNOSIS — I10 Essential (primary) hypertension: Secondary | ICD-10-CM

## 2022-08-10 LAB — BASIC METABOLIC PANEL
BUN: 10 mg/dL (ref 6–23)
CO2: 29 mEq/L (ref 19–32)
Calcium: 9.2 mg/dL (ref 8.4–10.5)
Chloride: 103 mEq/L (ref 96–112)
Creatinine, Ser: 0.96 mg/dL (ref 0.40–1.20)
GFR: 67.6 mL/min (ref >60.00)
Glucose, Bld: 98 mg/dL (ref 70–99)
Potassium: 3.6 mEq/L (ref 3.5–5.1)
Sodium: 139 mEq/L (ref 135–145)

## 2022-08-18 IMAGING — MG MM DIGITAL SCREENING BILAT W/ TOMO AND CAD
8 series · 8 of 24 positions shown · non-contrast
Comparison: Previous exam(s).

CLINICAL DATA: Screening.

EXAM:
DIGITAL SCREENING BILATERAL MAMMOGRAM WITH TOMOSYNTHESIS AND CAD
TECHNIQUE: Bilateral screening digital craniocaudal and mediolateral oblique
mammograms were obtained. Bilateral screening digital breast
tomosynthesis was performed. The images were evaluated with
computer-aided detection.

[R CC synth-2D]
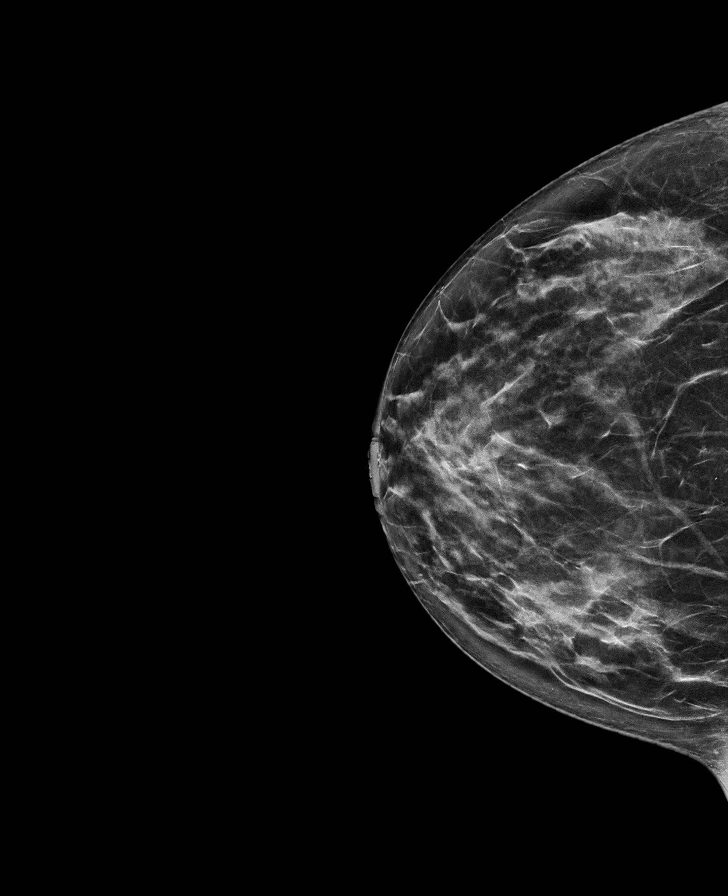

[L CC synth-2D]
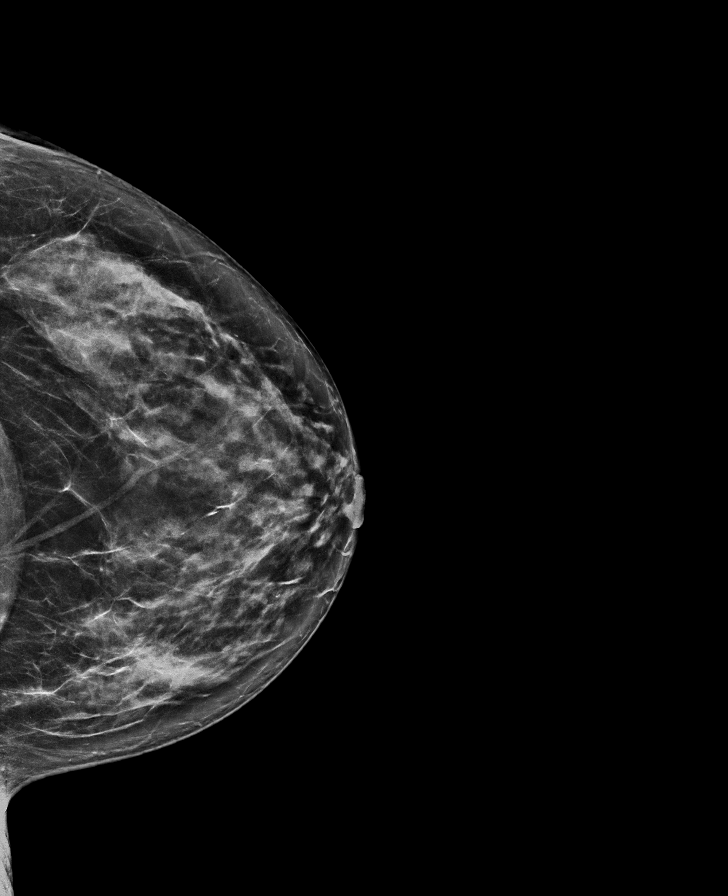

[L MLO synth-2D]
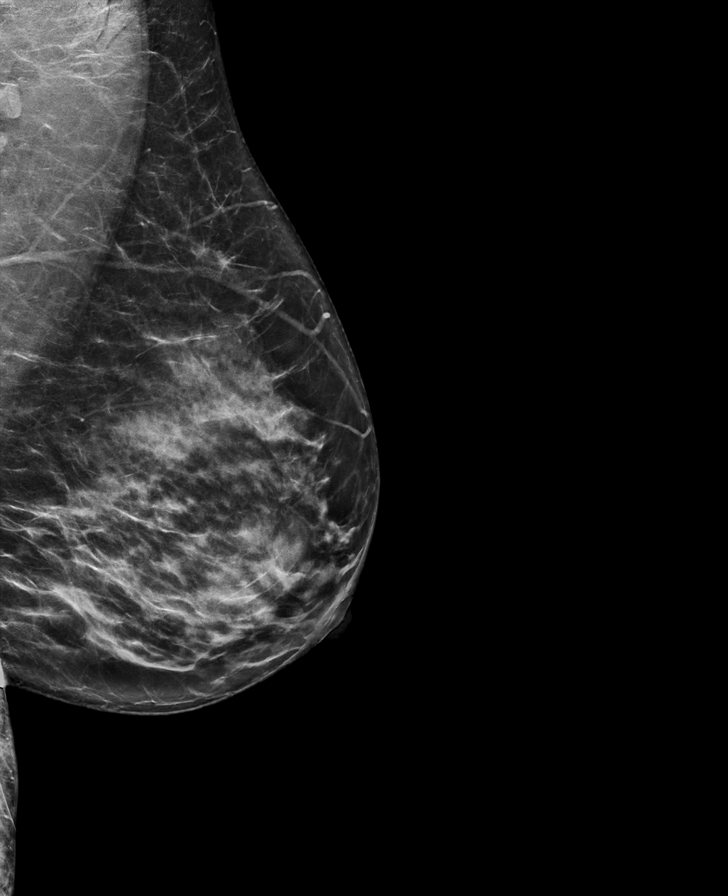

[R MLO synth-2D]
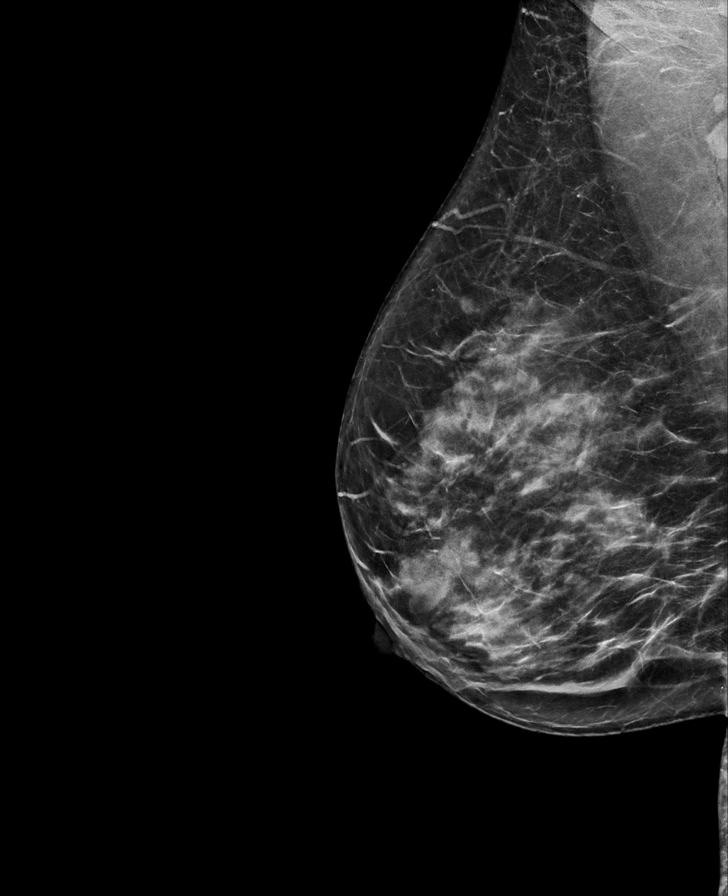

[R MLO tomo · tomo slice 38/75.0]
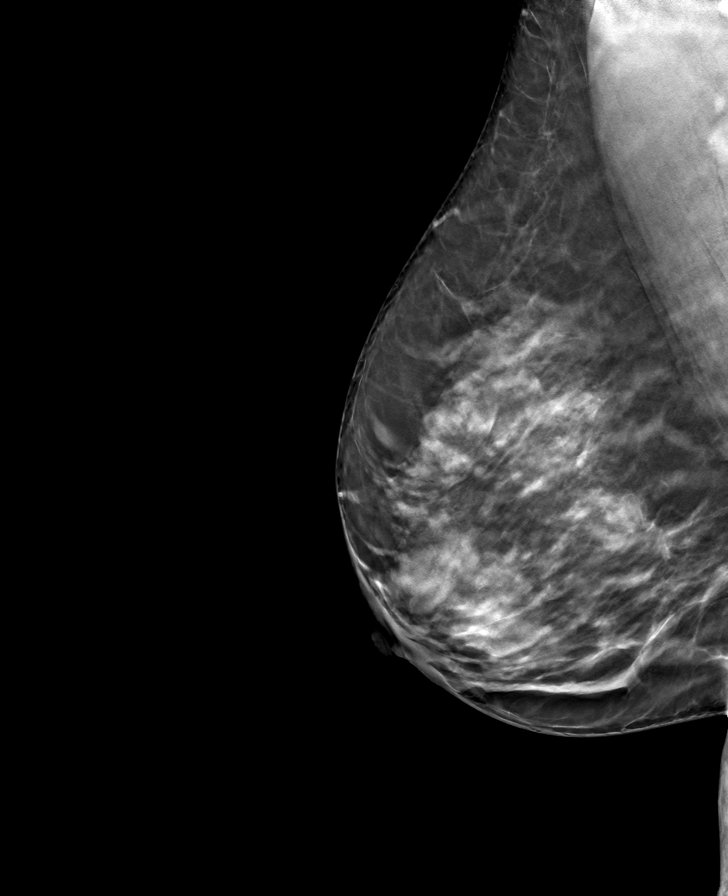

[L CC tomo · tomo slice 37/74.0]
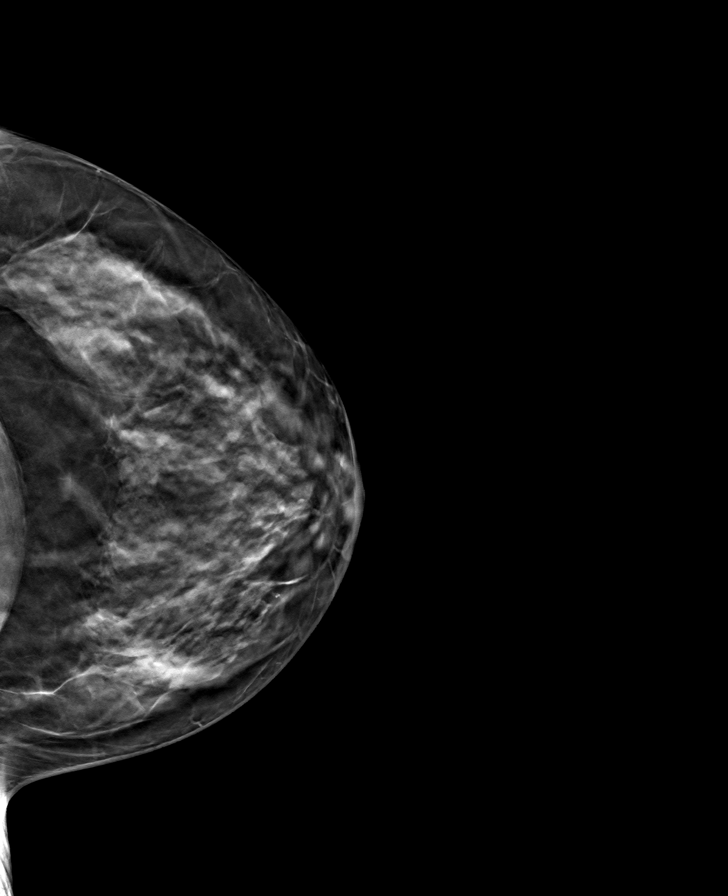

[L MLO tomo · tomo slice 38/75.0]
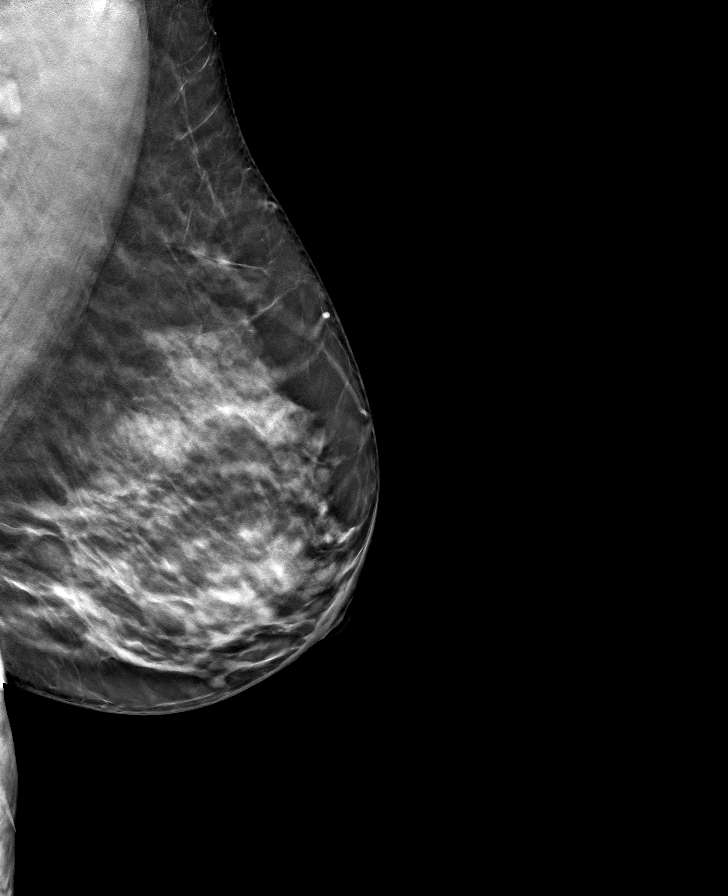

[R CC tomo · tomo slice 37/73.0]
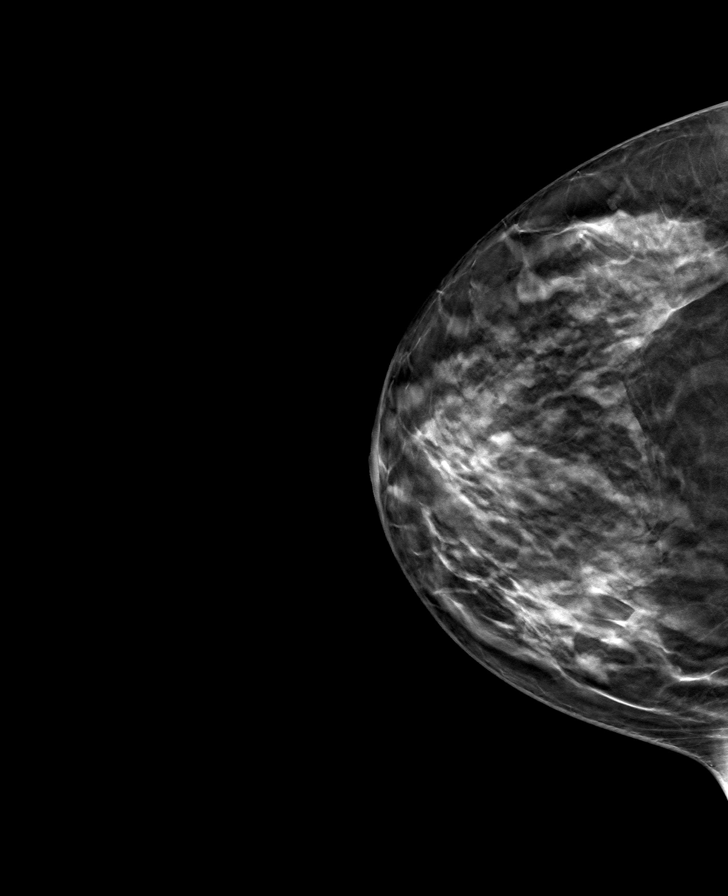

[8 of 24 positions shown; findings below may reference images not displayed]

ACR Breast Density Category c: The breast tissue is heterogeneously
dense, which may obscure small masses.
FINDINGS: There are no findings suspicious for malignancy.
IMPRESSION: No mammographic evidence of malignancy. A result letter of this
screening mammogram will be mailed directly to the patient.

RECOMMENDATION:
Screening mammogram in one year. (Code:Q3-W-BC3)

BI-RADS CATEGORY  1: Negative.

## 2022-09-18 ENCOUNTER — Other Ambulatory Visit: Payer: Self-pay | Admitting: Internal Medicine

## 2022-09-18 DIAGNOSIS — U071 COVID-19: Secondary | ICD-10-CM

## 2022-10-10 ENCOUNTER — Ambulatory Visit (HOSPITAL_BASED_OUTPATIENT_CLINIC_OR_DEPARTMENT_OTHER): Payer: 59 | Admitting: Cardiovascular Disease

## 2022-10-21 ENCOUNTER — Encounter: Payer: Self-pay | Admitting: Internal Medicine

## 2022-10-22 ENCOUNTER — Other Ambulatory Visit: Payer: Self-pay

## 2022-10-22 MED ORDER — LOSARTAN POTASSIUM 50 MG PO TABS: ORAL_TABLET | ORAL | 1 refills | Status: DC

## 2022-12-21 ENCOUNTER — Ambulatory Visit (INDEPENDENT_AMBULATORY_CARE_PROVIDER_SITE_OTHER): Payer: 59 | Admitting: Internal Medicine

## 2022-12-21 ENCOUNTER — Encounter: Payer: Self-pay | Admitting: Internal Medicine

## 2022-12-21 ENCOUNTER — Other Ambulatory Visit (HOSPITAL_COMMUNITY): Payer: Self-pay

## 2022-12-21 ENCOUNTER — Telehealth: Payer: Self-pay

## 2022-12-21 VITALS — BP 124/78 | HR 94 | Temp 98.3°F | Ht 62.0 in | Wt 210.6 lb

## 2022-12-21 DIAGNOSIS — E042 Nontoxic multinodular goiter: Secondary | ICD-10-CM

## 2022-12-21 DIAGNOSIS — E669 Obesity, unspecified: Secondary | ICD-10-CM | POA: Diagnosis not present

## 2022-12-21 DIAGNOSIS — Z6838 Body mass index (BMI) 38.0-38.9, adult: Secondary | ICD-10-CM

## 2022-12-21 DIAGNOSIS — I10 Essential (primary) hypertension: Secondary | ICD-10-CM | POA: Diagnosis not present

## 2022-12-21 DIAGNOSIS — E785 Hyperlipidemia, unspecified: Secondary | ICD-10-CM | POA: Diagnosis not present

## 2022-12-21 DIAGNOSIS — I471 Supraventricular tachycardia, unspecified: Secondary | ICD-10-CM

## 2022-12-21 DIAGNOSIS — N1831 Chronic kidney disease, stage 3a: Secondary | ICD-10-CM

## 2022-12-21 DIAGNOSIS — G4710 Hypersomnia, unspecified: Secondary | ICD-10-CM

## 2022-12-21 LAB — COMPREHENSIVE METABOLIC PANEL
ALT: 20 U/L (ref 0–35)
AST: 18 U/L (ref 0–37)
Albumin: 4.1 g/dL (ref 3.5–5.2)
Alkaline Phosphatase: 87 U/L (ref 39–117)
BUN: 12 mg/dL (ref 6–23)
CO2: 30 mEq/L (ref 19–32)
Calcium: 9.6 mg/dL (ref 8.4–10.5)
Chloride: 98 mEq/L (ref 96–112)
Creatinine, Ser: 1.01 mg/dL (ref 0.40–1.20)
GFR: 63.44 mL/min (ref >60.00)
Glucose, Bld: 92 mg/dL (ref 70–99)
Potassium: 3 mEq/L — ABNORMAL LOW (ref 3.5–5.1)
Sodium: 139 mEq/L (ref 135–145)
Total Bilirubin: 0.5 mg/dL (ref 0.2–1.2)
Total Protein: 7.2 g/dL (ref 6.0–8.3)

## 2022-12-21 LAB — MICROALBUMIN / CREATININE URINE RATIO
Creatinine,U: 149.3 mg/dL
Microalb Creat Ratio: 0.5 mg/g (ref 0.0–30.0)
Microalb, Ur: <0.7 mg/dL (ref 0.0–1.9)

## 2022-12-21 LAB — LIPID PANEL
Cholesterol: 177 mg/dL (ref 0–200)
HDL: 53.3 mg/dL (ref >39.00)
LDL Cholesterol: 104 mg/dL — ABNORMAL HIGH (ref 0–99)
NonHDL: 124.03
Total CHOL/HDL Ratio: 3
Triglycerides: 99 mg/dL (ref 0.0–149.0)
VLDL: 19.8 mg/dL (ref 0.0–40.0)

## 2022-12-21 LAB — LDL CHOLESTEROL, DIRECT: Direct LDL: 112 mg/dL

## 2022-12-21 LAB — TSH: TSH: 1.71 u[IU]/mL (ref 0.35–5.50)

## 2022-12-21 MED ORDER — CARVEDILOL 6.25 MG PO TABS
6.2500 mg | ORAL_TABLET | Freq: Two times a day (BID) | ORAL | 3 refills | Status: DC
Start: 2022-12-21 — End: 2023-04-23

## 2022-12-21 MED ORDER — PEN NEEDLES 31G X 6 MM MISC: 0 refills | Status: DC

## 2022-12-21 MED ORDER — VICTOZA 18 MG/3ML ~~LOC~~ SOPN: PEN_INJECTOR | SUBCUTANEOUS | 0 refills | Status: DC

## 2022-12-21 MED ORDER — METOPROLOL SUCCINATE ER 25 MG PO TB24: 12.5000 mg | ORAL_TABLET | Freq: Every day | ORAL | 1 refills | Status: DC

## 2022-12-21 NOTE — Telephone Encounter (Signed)
noted 

## 2022-12-21 NOTE — Progress Notes (Unsigned)
Subjective:  Patient ID: Theresa Fox, female    DOB: 1971-05-10  Age: 52 y.o. MRN: [id number]  CC: The primary encounter diagnosis was Essential hypertension. 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   HPI Concetta Dangelo presents for  Chief Complaint  Patient presents with   Medical Management of Chronic Issues    1) obesity:  started topomax last month, prescribed by another provider , at  starting dose,  .   metformin dose increased to 3 daily with plans to increase  . Has not lost any weight but exercising regularly.     NOTICES some tachypena and tachycardia . Exercising 2-3 days per week .   Still fatigued,  wakes up exhausted,  snores ,  too tired to get up and exercise . Working from home,  teaching one night per week.  Discussed trial of victoza.  No C/I  . H/o MNG with benign biopsy in 2021  needs repeat US   2)  HTN:  had to reduce dose of metoprolol to 12.5 mg and  losartan to 25 mg due to low BP.    takes a full hctz  in am   Outpatient Medications Prior to Visit  Medication Sig Dispense Refill   hydrochlorothiazide (HYDRODIURIL) 25 MG tablet Take 1 tablet (25 mg total) by mouth daily. 90 tablet 1   losartan (COZAAR) 50 MG tablet TAKE 1 TABLET(50 MG) BY MOUTH AT BEDTIME (Patient taking differently: Take 25 mg by mouth daily.) 90 tablet 1   metFORMIN (GLUCOPHAGE XR) 500 MG 24 hr tablet Take 1 tablet (500 mg total) by mouth daily with breakfast. 90 tablet 1   topiramate (TOPAMAX) 25 MG tablet Take 50 mg by mouth daily.     metoprolol succinate (TOPROL XL) 25 MG 24 hr tablet Take 1 tablet (25 mg total) by mouth daily. (Patient taking differently: Take 12.5 mg by mouth daily.) 90 tablet 3   LOMAIRA 8 MG TABS Take 1-2 tablets by mouth daily.      PREVIDENT 5000 PLUS 1.1 % CREA dental cream SMARTSIG:Application By Mouth (Patient not taking: Reported on 06/22/2022)     Semaglutide, 1 MG/DOSE, (OZEMPIC, 1 MG/DOSE,) 4 MG/3ML SOPN Inject 0.25 mg into the skin once a week. 3 mL 0   spironolactone (ALDACTONE) 25 MG tablet Take 1 tablet (25 mg total) by mouth daily. Take 1/2 tablet daily and ok to use 1/2 tablet as needed for edema 90 tablet 3   No facility-administered medications prior to visit.    Review of Systems;  Patient denies headache, fevers, malaise, unintentional weight loss, skin rash, eye pain, sinus congestion and sinus pain, sore throat, dysphagia,  hemoptysis , cough, dyspnea, wheezing, chest pain, palpitations, orthopnea, edema, abdominal pain, nausea, melena, diarrhea, constipation, flank pain, dysuria, hematuria, urinary  Frequency, nocturia, numbness, tingling, seizures,  Focal weakness, Loss of consciousness,  Tremor, insomnia, depression, anxiety, and suicidal ideation.      Objective:  BP 124/78   Pulse 94   Temp 98.3 F (36.8 C) (Oral)   Ht 5\' 2"  (1.575 m)   Wt 210 lb 9.6 oz (95.5 kg)   SpO2 98%   BMI 38.52 kg/m   BP Readings from Last 3 Encounters:  12/21/22 124/78  06/22/22 128/82  04/09/22 138/86    Wt Readings from Last 3 Encounters:  12/21/22  210 lb 9.6 oz (95.5 kg)  06/22/22 205 lb 9.6 oz (93.3 kg)  04/09/22 199 lb 1.6 oz (90.3 kg)    Physical Exam Vitals reviewed.  Constitutional:      General: She is not in acute distress.    Appearance: Normal appearance. She is normal weight. She is not ill-appearing, toxic-appearing or diaphoretic.  HENT:     Head: Normocephalic.  Eyes:     General: No scleral icterus.       Right eye: No discharge.        Left eye: No discharge.     Conjunctiva/sclera: Conjunctivae normal.  Cardiovascular:     Rate and Rhythm: Normal rate and regular rhythm.     Heart sounds: Normal heart sounds.  Pulmonary:     Effort: Pulmonary effort is normal. No respiratory  distress.     Breath sounds: Normal breath sounds.  Musculoskeletal:        General: Normal range of motion.  Skin:    General: Skin is warm and dry.  Neurological:     General: No focal deficit present.     Mental Status: She is alert and oriented to person, place, and time. Mental status is at baseline.  Psychiatric:        Mood and Affect: Mood normal.        Behavior: Behavior normal.        Thought Content: Thought content normal.        Judgment: Judgment normal.    Lab Results  Component Value Date   HGBA1C 5.9 07/20/2022   HGBA1C 5.6 12/07/2021   HGBA1C 5.7 06/08/2021    Lab Results  Component Value Date   CREATININE 1.01 12/21/2022   CREATININE 0.96 08/10/2022   CREATININE 0.99 07/20/2022    Lab Results  Component Value Date   WBC 7.4 07/20/2022   HGB 13.5 07/20/2022   HCT 38.5 07/20/2022   PLT 338.0 07/20/2022   GLUCOSE 92 12/21/2022   CHOL 177 12/21/2022   TRIG 99.0 12/21/2022   HDL 53.30 12/21/2022   LDLDIRECT 112.0 12/21/2022   LDLCALC 104 (H) 12/21/2022   ALT 20 12/21/2022   AST 18 12/21/2022   NA 139 12/21/2022   K 3.0 (L) 12/21/2022   CL 98 12/21/2022   CREATININE 1.01 12/21/2022   BUN 12 12/21/2022   CO2 30 12/21/2022   TSH 1.71 12/21/2022   HGBA1C 5.9 07/20/2022   MICROALBUR <0.7 12/21/2022    No results found.  Assessment & Plan:  .Essential hypertension Assessment & Plan: Chaging metoprolol to carbedilol , which she preferred , 6.25 mg bid  ad continue  hctz 25 mg, advised to suspend losarta for one week and resuem for BP 140/90 or higher  Orders: -     Microalbumin / creatinine urine ratio -     Comprehensive metabolic panel  Multinodular thyroid Assessment & Plan: Last [place] was in 2021 followed by benign biopsy of nodule on right.  Repeat U/s ordered   Orders: -     TSH -     US THYROID; Future  Hyperlipidemia, unspecified hyperlipidemia type -     Lipid panel -     LDL cholesterol, direct  Obesity (BMI  30-39.9) Assessment & Plan: Stopping  topiramate 25 mg  and starting Victoza   Hypersomnolence -     PSG SLEEP STUDY; Future  SVT (supraventricular tachycardia) (HCC) Assessment & Plan: She reports recurrent episodes of SVT that respond to propranolol.  Only one cup  of coffee daily.  TSH and hgb wnl.  We discussed how topiramate and phentermine may be contributing to palpitations.  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   neprhrology referral advised  Lab Results  Component Value Date   CREATININE 1.01 12/21/2022   Lab Results  Component Value Date   MICROALBUR <0.7 12/21/2022   MICROALBUR 2.2 (H) 12/07/2021        Other orders -     Carvedilol; Take 1 tablet (6.25 mg total) by mouth 2 (two) times daily with a meal.  Dispense: 60 tablet; Refill: 3 -     Victoza; Start 0.6mg  SQ once a day for 7 days, then increase to 1.2mg  once a day  Dispense: 6 mL; Refill: 0     Follow-up: Return in about 3 months (around 03/23/2023).   Sherlene Shams, MD

## 2022-12-21 NOTE — Telephone Encounter (Signed)
Please see separate encounter for outcome

## 2022-12-21 NOTE — Telephone Encounter (Signed)
PA for Victoza is needed 

## 2022-12-21 NOTE — Assessment & Plan Note (Addendum)
Chaging metoprolol to carbedilol , which she preferred , 6.25 mg bid  ad continue  hctz 25 mg, advised to suspend losarta for one week and resuem for BP 140/90 or higher

## 2022-12-21 NOTE — Telephone Encounter (Signed)
Pharmacy Patient Advocate Encounter   Received notification from [facility] that prior authorization for VICTOZA is needed.    PER TEST CLAIM NO PA IS REQUIRED AS LONG AS PHARMACY BILLS A COVERED ICD-10 CODE (like diabetes)     After I added E11.9 to test claim I was able to get a paid claim. Spoke with [facility] to process. Patient should get a call when ready for pickup.   Haze Rushing, CPhT Pharmacy Patient Advocate Specialist Direct Number: [phone] Fax: [phone]

## 2022-12-21 NOTE — Assessment & Plan Note (Signed)
She reports recurrent episodes of SVT that respond to propranolol.  Only one cup of coffee daily.  TSH and hgb wnl.  We discussed how topiramate and phentermine may be contributing to palpitations.  Stopping topiramate and resuming carvedilol

## 2022-12-21 NOTE — Assessment & Plan Note (Signed)
Last [place] was in 2021 followed by benign biopsy of nodule on right.  Repeat U/s ordered

## 2022-12-21 NOTE — Patient Instructions (Addendum)
For the blood pressure and SVT:  Stop metoprolol and start carvedilol twice daily  Suspend losartan until BP evens out ( 1 week).  Add 1/2 tablet losartan for BP > 140/90 after one week   Continue hctz   Stop topiramate.   For weight loss  Victoza is on your formulary , but requires Prior authorization . The dose is titrated weekly,  starting dose 0.6 mg  daily for one week,  then 1.2 mg daily , followed by 1.8 mg weekly as max dose

## 2022-12-21 NOTE — Assessment & Plan Note (Addendum)
Stopping  topiramate 25 mg  and starting Victoza

## 2022-12-23 MED ORDER — POTASSIUM CHLORIDE CRYS ER 20 MEQ PO TBCR
20.0000 meq | EXTENDED_RELEASE_TABLET | Freq: Every day | ORAL | 3 refills | Status: DC
Start: 2022-12-23 — End: 2023-04-23

## 2022-12-23 NOTE — Assessment & Plan Note (Signed)
May be due to NSAID use and/or HTN.   neprhrology referral advised  Lab Results  Component Value Date   CREATININE 1.01 12/21/2022   Lab Results  Component Value Date   MICROALBUR <0.7 12/21/2022   MICROALBUR 2.2 (H) 12/07/2021

## 2022-12-23 NOTE — Addendum Note (Signed)
Addended by: Sherlene Shams on: 12/23/2022 07:39 PM   Modules accepted: Orders

## 2022-12-24 ENCOUNTER — Other Ambulatory Visit (HOSPITAL_COMMUNITY): Payer: Self-pay

## 2023-01-07 ENCOUNTER — Ambulatory Visit: Payer: 59 | Attending: Internal Medicine

## 2023-01-10 ENCOUNTER — Other Ambulatory Visit: Payer: Self-pay | Admitting: Internal Medicine

## 2023-01-10 MED ORDER — VICTOZA 18 MG/3ML ~~LOC~~ SOPN: PEN_INJECTOR | SUBCUTANEOUS | 5 refills | Status: DC

## 2023-01-22 ENCOUNTER — Encounter: Payer: Self-pay | Admitting: Internal Medicine

## 2023-01-22 ENCOUNTER — Other Ambulatory Visit (HOSPITAL_COMMUNITY): Payer: Self-pay

## 2023-01-22 NOTE — Telephone Encounter (Signed)
Has the PA for Victoza been submitted yet?

## 2023-01-23 ENCOUNTER — Telehealth: Payer: Self-pay

## 2023-01-23 ENCOUNTER — Other Ambulatory Visit (HOSPITAL_COMMUNITY): Payer: Self-pay

## 2023-01-23 MED ORDER — SAXENDA 18 MG/3ML ~~LOC~~ SOPN: PEN_INJECTOR | SUBCUTANEOUS | 0 refills | Status: DC

## 2023-01-23 NOTE — Telephone Encounter (Signed)
I am sending saxenda instead, which hopefully will be ocvered for weight management.  It requires prior authorization   It is injected daily in incrementally increasing doses (if tolerated,  Nausea usually resolves in a few days)"  0.6 mg daily   Week 1 1.2 mg daily Week 2 1.8 mg  [place] Week 3 2.4 mg daily Week 4 3.0 mg daily Week 5 and ongoing ((you will need another rx for week 5)

## 2023-01-23 NOTE — Telephone Encounter (Signed)
Pharmacy Patient Advocate Encounter   Received notification from [facility] that prior authorization for Victoza 18MG /3ML pen-injectors is required/requested.   Victoza is FDA approved for type 2 diabetes. No indication of diabetes on patient's chart. PA also requires documentation confirming type 2 diabetes. Please advise.

## 2023-01-23 NOTE — Telephone Encounter (Signed)
Insurance will not cover because pt does not have a diagnosis of diabetes.

## 2023-01-24 ENCOUNTER — Other Ambulatory Visit (HOSPITAL_COMMUNITY): Payer: Self-pay

## 2023-01-24 NOTE — Telephone Encounter (Signed)
Pharmacy Patient Advocate Encounter   Received notification that prior authorization for Saxenda 18MG /3ML pen-injectors is required/requested.   PA submitted to [facility] via CoverMyMeds Key or (Medicaid) confirmation # [id number] Status is pending

## 2023-01-25 ENCOUNTER — Encounter: Payer: Self-pay | Admitting: Internal Medicine

## 2023-01-25 NOTE — Telephone Encounter (Signed)
Pharmacy Patient Advocate Encounter  Received notification from [employer] that the request for prior authorization for Saxenda 18MG /3ML pen-injectors has been denied due to plan exclusion.        Please be advised we currently do not have a Pharmacist to review denials, therefore you will need to process appeals accordingly as needed. Thanks for your support at this time.   You may fax [phone] to appeal.

## 2023-01-29 NOTE — Telephone Encounter (Signed)
See previous mychart message

## 2023-01-29 NOTE — Telephone Encounter (Signed)
Victoza is not covered by your insurance either. It is only covered if you have diabetes.

## 2023-02-05 ENCOUNTER — Telehealth (INDEPENDENT_AMBULATORY_CARE_PROVIDER_SITE_OTHER): Payer: 59 | Admitting: Internal Medicine

## 2023-02-05 ENCOUNTER — Encounter: Payer: Self-pay | Admitting: Internal Medicine

## 2023-02-05 VITALS — BP 132/88 | HR 80 | Ht 62.0 in | Wt 210.0 lb

## 2023-02-05 DIAGNOSIS — I10 Essential (primary) hypertension: Secondary | ICD-10-CM

## 2023-02-05 DIAGNOSIS — R5383 Other fatigue: Secondary | ICD-10-CM

## 2023-02-05 DIAGNOSIS — N1831 Chronic kidney disease, stage 3a: Secondary | ICD-10-CM | POA: Diagnosis not present

## 2023-02-05 DIAGNOSIS — E669 Obesity, unspecified: Secondary | ICD-10-CM

## 2023-02-05 DIAGNOSIS — E876 Hypokalemia: Secondary | ICD-10-CM | POA: Diagnosis not present

## 2023-02-05 MED ORDER — HYOSCYAMINE SULFATE 0.125 MG PO TBDP: 0.1250 mg | ORAL_TABLET | Freq: Four times a day (QID) | ORAL | 0 refills | Status: AC | PRN

## 2023-02-05 MED ORDER — HYDROCHLOROTHIAZIDE 25 MG PO TABS: 25.0000 mg | ORAL_TABLET | Freq: Every day | ORAL | 1 refills | Status: DC

## 2023-02-05 MED ORDER — METFORMIN HCL ER 500 MG PO TB24: 500.0000 mg | ORAL_TABLET | Freq: Every day | ORAL | 1 refills | Status: DC

## 2023-02-05 NOTE — Progress Notes (Unsigned)
Virtual Visit via Caregility   Note   This format is felt to be most appropriate for this patient at this time.  All issues noted in this document were discussed and addressed.  No physical exam was performed (except for noted visual exam findings with Video Visits).   I connected withNAME@ on 02/06/23 at  4:30 PM EDT by a video enabled telemedicine application or telephone and verified that I am speaking with the correct person using two identifiers. Location patient: home Location provider: work or home office Persons participating in the virtual visit: patient, provider  I discussed the limitations, risks, security and privacy concerns of performing an evaluation and management service by telephone and the availability of in person appointments. I also discussed with the patient that there may be a patient responsible charge related to this service. The patient expressed understanding and agreed to proceed.  Reason for visit:follow up on hypertension,  fatigue,  obesity and snoring   HPI:  1) reviewed recent home sleep study:  AHI was 9.1 on Night #1 and 4.6 on Night #2 . Reviewed parameters /criteria for treatment.   2) Recently had an episode of atypical chest pain which lasted 12 hours, resolved spontaneously.Marland Kitchen  occurred at rest.   3) Hypertension: taking carvedilol ,  hydrochlorothiazide.  potassium.  Feels less fatigued on carvedilol vs metoprolol.   Home readings reviewed,  diastolic is typically > 80 , systolic 130 to 140   4) Obesity:  exercising regulalrly, denies any history of chest pain with exertion.     ROS: See pertinent positives and negatives per HPI.  Past Medical History:  Diagnosis Date   Hypertension    Lower extremity edema 04/09/2022   Supraventricular tachycardia, nonsustained     Past Surgical History:  Procedure Laterality Date   CESAREAN SECTION CLASSICAL     COLONOSCOPY WITH PROPOFOL N/A 04/03/2021   Procedure: COLONOSCOPY WITH PROPOFOL;  Surgeon:  Midge Minium, MD;  Location: [facility];  Service: Endoscopy;  Laterality: N/A;    Family History  Problem Relation Age of Onset   Hypertension Mother    Heart attack Father    Hypertension Father    Coronary artery disease Father    Stroke Father    Hypertension Brother    Heart attack Paternal Grandmother    Breast cancer Neg Hx     SOCIAL HX:  reports that she has never smoked. She has never used smokeless tobacco. She reports current alcohol use of about 7.0 standard drinks of alcohol per week. She reports that she does not use drugs.    Current Outpatient Medications:    carvedilol (COREG) 6.25 MG tablet, Take 1 tablet (6.25 mg total) by mouth 2 (two) times daily with a meal., Disp: 60 tablet, Rfl: 3   hyoscyamine (ANASPAZ) 0.125 MG TBDP disintergrating tablet, Place 1 tablet (0.125 mg total) under the tongue every 6 (six) hours as needed. For esophageal spasm, Disp: 30 tablet, Rfl: 0   losartan (COZAAR) 50 MG tablet, TAKE 1 TABLET(50 MG) BY MOUTH AT BEDTIME (Patient taking differently: Take 25 mg by mouth daily.), Disp: 90 tablet, Rfl: 1   potassium chloride SA (KLOR-CON M) 20 MEQ tablet, Take 1 tablet (20 mEq total) by mouth daily., Disp: 30 tablet, Rfl: 3   hydrochlorothiazide (HYDRODIURIL) 25 MG tablet, Take 1 tablet (25 mg total) by mouth daily., Disp: 90 tablet, Rfl: 1   metFORMIN (GLUCOPHAGE XR) 500 MG 24 hr tablet, Take 1 tablet (500 mg total) by  mouth daily with breakfast., Disp: 90 tablet, Rfl: 1  EXAM:  VITALS per patient if applicable:  GENERAL: alert, oriented, appears well and in no acute distress  HEENT: atraumatic, conjunttiva clear, no obvious abnormalities on inspection of external nose and ears  NECK: normal movements of the head and neck  LUNGS: on inspection no signs of respiratory distress, breathing rate appears normal, no obvious gross SOB, gasping or wheezing  CV: no obvious cyanosis  MS: moves all visible extremities without noticeable  abnormality  PSYCH/NEURO: pleasant and cooperative, no obvious depression or anxiety, speech and thought processing grossly intact  ASSESSMENT AND PLAN:  Hypokalemia -     Aldosterone + renin activity w/ ratio -     Basic metabolic panel  Essential hypertension Assessment & Plan: Tolerating the change from  metoprolol to carvedilol , which she preferred , 6.25 mg bid  and c hctz 25 mg, has not had to use  losartan for BP 140/90 or higher.  Diastolic remains elelvated.  Checking for hyperaldosteronism given recurrent hypokalemia. Sleep study results borderline and .  Adding spironolactone   Other fatigue Assessment & Plan: Improving with medication change. Workup for OSA showed barely diagnostic criteria( AHI 9,1 and 4,6)    CKD stage G3a/A1, GFR 45-59 and albumin creatinine ratio <30 mg/g (HCC) Assessment & Plan: May be due to NSAID use and/or HTN.   neprhrology referral advised  Lab Results  Component Value Date   CREATININE 1.01 12/21/2022   Lab Results  Component Value Date   MICROALBUR <0.7 12/21/2022   MICROALBUR 2.2 (H) 12/07/2021        Obesity (BMI 30-39.9) Assessment & Plan: At last visit we stopped  topiramate 25 mg  and startied Victoza; however she was unable to start medication due to insurance noncoverage    Other orders -     hydroCHLOROthiazide; Take 1 tablet (25 mg total) by mouth daily.  Dispense: 90 tablet; Refill: 1 -     metFORMIN HCl ER; Take 1 tablet (500 mg total) by mouth daily with breakfast.  Dispense: 90 tablet; Refill: 1 -     Hyoscyamine Sulfate; Place 1 tablet (0.125 mg total) under the tongue every 6 (six) hours as needed. For esophageal spasm  Dispense: 30 tablet; Refill: 0      I discussed the assessment and treatment plan with the patient. The patient was provided an opportunity to ask questions and all were answered. The patient agreed with the plan and demonstrated an understanding of the instructions.   The patient was advised  to call back or seek an in-person evaluation if the symptoms worsen or if the condition fails to improve as anticipated.   I spent 30 minutes dedicated to the care of this patient on the date of this encounter to include pre-visit review of his medical history,  review of recent sleep study, Face-to-face time with the patient , and post visit ordering of testing and therapeutics.    Sherlene Shams, MD

## 2023-02-05 NOTE — Assessment & Plan Note (Signed)
Improving with medication change. Workup for OSA showed barely diagnostic criteria( AHI 9,1 and 4,6)

## 2023-02-05 NOTE — Assessment & Plan Note (Addendum)
Tolerating the change from  metoprolol to carvedilol , which she preferred , 6.25 mg bid  and c hctz 25 mg, has not had to use  losartan for BP 140/90 or higher.  Diastolic remains elelvated.  Checking for hyperaldosteronism given recurrent hypokalemia. Sleep study results borderline and .  Adding spironolactone

## 2023-02-06 NOTE — Assessment & Plan Note (Signed)
At last visit we stopped  topiramate 25 mg  and startied Victoza; however she was unable to start medication due to insurance noncoverage

## 2023-02-06 NOTE — Assessment & Plan Note (Signed)
May be due to NSAID use and/or HTN.   neprhrology referral advised  Lab Results  Component Value Date   CREATININE 1.01 12/21/2022   Lab Results  Component Value Date   MICROALBUR <0.7 12/21/2022   MICROALBUR 2.2 (H) 12/07/2021      

## 2023-02-22 ENCOUNTER — Encounter: Payer: Self-pay | Admitting: Internal Medicine

## 2023-03-27 ENCOUNTER — Ambulatory Visit: Payer: 59 | Admitting: Internal Medicine

## 2023-04-22 ENCOUNTER — Other Ambulatory Visit: Payer: Self-pay | Admitting: Internal Medicine

## 2023-04-23 ENCOUNTER — Other Ambulatory Visit: Payer: Self-pay | Admitting: Internal Medicine

## 2023-04-23 ENCOUNTER — Encounter: Payer: Self-pay | Admitting: Internal Medicine

## 2023-04-23 ENCOUNTER — Other Ambulatory Visit: Payer: Self-pay

## 2023-04-23 MED ORDER — POTASSIUM CHLORIDE CRYS ER 20 MEQ PO TBCR: 20.0000 meq | EXTENDED_RELEASE_TABLET | Freq: Every day | ORAL | 3 refills | Status: DC

## 2023-07-03 ENCOUNTER — Telehealth: Payer: Self-pay

## 2023-07-03 NOTE — Telephone Encounter (Signed)
LMTCB

## 2023-07-03 NOTE — Telephone Encounter (Signed)
Patient states she has been experiencing high blood pressure since she received her flu shot.  Patient states her blood pressure reading today was 145/90 something. Patient states her blood pressure has been fluctuating for the last 2-3 days with the highest reading at 140/102 yesterday.  Patient states yesterday she may have had blurry vision at the same time that her blood pressure was 140/102 and her shoulders were tense.   I transferred call to Access Nurse.

## 2023-07-04 ENCOUNTER — Encounter: Payer: Self-pay | Admitting: Internal Medicine

## 2023-07-04 NOTE — Telephone Encounter (Signed)
Pt has been scheduled for Monday at 5 pm for a virtual visit and advised to continue checking her bp once a day until the appt so Dr. Darrick Huntsman has some readings for the appt.

## 2023-07-08 ENCOUNTER — Telehealth (INDEPENDENT_AMBULATORY_CARE_PROVIDER_SITE_OTHER): Payer: 59 | Admitting: Internal Medicine

## 2023-07-08 ENCOUNTER — Encounter: Payer: Self-pay | Admitting: Internal Medicine

## 2023-07-08 VITALS — BP 142/95 | HR 82 | Ht 62.0 in | Wt 207.0 lb

## 2023-07-08 DIAGNOSIS — I10 Essential (primary) hypertension: Secondary | ICD-10-CM | POA: Diagnosis not present

## 2023-07-08 NOTE — Progress Notes (Unsigned)
Virtual Visit via Caregility   Note   This format is felt to be most appropriate for this patient at this time.  All issues noted in this document were discussed and addressed.  No physical exam was performed (except for noted visual exam findings with Video Visits).   I connected with Theresa Fox on 07/08/23 at  5:00 PM EST by a video enabled telemedicine application and verified that I am speaking with the correct person using two identifiers. Location patient: home Location provider: work or home office Persons participating in the virtual visit: patient, provider  I discussed the limitations, risks, security and privacy concerns of performing an evaluation and management service by telephone and the availability of in person appointments. I also discussed with the patient that there may be a patient responsible charge related to this service. The patient expressed understanding and agreed to proceed.  Interactive audio and video telecommunications were attempted between this provider and patient, however failed, due to patient having technical difficulties OR patient did not have access to video capability.  We continued and completed visit with audio only. ***  Reason for visit:  Elevated blood pressure   HPI:   52 yr old feamale with hypertension currenlty taking losartan prn (50) for sbp > 130 c arvedilol 6.25 and hydrochlorothiazide 25    ROS: See pertinent positives and negatives per HPI.  Past Medical History:  Diagnosis Date   Hypertension    Lower extremity edema 04/09/2022   Supraventricular tachycardia, nonsustained (HCC)     Past Surgical History:  Procedure Laterality Date   CESAREAN SECTION CLASSICAL     COLONOSCOPY WITH PROPOFOL N/A 04/03/2021   Procedure: COLONOSCOPY WITH PROPOFOL;  Surgeon: Midge Minium, MD;  Location: [facility];  Service: Endoscopy;  Laterality: N/A;    Family History  Problem Relation Age of Onset   Hypertension Mother    Heart  attack Father    Hypertension Father    Coronary artery disease Father    Stroke Father    Hypertension Brother    Heart attack Paternal Grandmother    Breast cancer Neg Hx     SOCIAL HX: ***   Current Outpatient Medications:    buPROPion (WELLBUTRIN XL) 150 MG 24 hr tablet, Take 150 mg by mouth daily., Disp: , Rfl:    carvedilol (COREG) 6.25 MG tablet, TAKE 1 TABLET(6.25 MG) BY MOUTH TWICE DAILY WITH A MEAL, Disp: 60 tablet, Rfl: 3   hydrochlorothiazide (HYDRODIURIL) 25 MG tablet, Take 1 tablet (25 mg total) by mouth daily., Disp: 90 tablet, Rfl: 1   hyoscyamine (ANASPAZ) 0.125 MG TBDP disintergrating tablet, Place 1 tablet (0.125 mg total) under the tongue every 6 (six) hours as needed. For esophageal spasm, Disp: 30 tablet, Rfl: 0   losartan (COZAAR) 50 MG tablet, TAKE 1 TABLET(50 MG) BY MOUTH AT BEDTIME (Patient taking differently: Take 25 mg by mouth daily. As needed), Disp: 90 tablet, Rfl: 1   metFORMIN (GLUCOPHAGE XR) 500 MG 24 hr tablet, Take 1 tablet (500 mg total) by mouth daily with breakfast., Disp: 90 tablet, Rfl: 1   naltrexone (DEPADE) 50 MG tablet, Take 25 mg by mouth daily., Disp: , Rfl:    potassium chloride SA (KLOR-CON M) 20 MEQ tablet, Take 1 tablet (20 mEq total) by mouth daily., Disp: 30 tablet, Rfl: 3  EXAM:  VITALS per patient if applicable:  GENERAL: alert, oriented, appears well and in no acute distress  HEENT: atraumatic, conjunttiva clear, no obvious abnormalities on inspection of  external nose and ears  NECK: normal movements of the head and neck  LUNGS: on inspection no signs of respiratory distress, breathing rate appears normal, no obvious gross SOB, gasping or wheezing  CV: no obvious cyanosis  MS: moves all visible extremities without noticeable abnormality  PSYCH/NEURO: pleasant and cooperative, no obvious depression or anxiety, speech and thought processing grossly intact  ASSESSMENT AND PLAN: There are no diagnoses linked to this  encounter.    I discussed the assessment and treatment plan with the patient. The patient was provided an opportunity to ask questions and all were answered. The patient agreed with the plan and demonstrated an understanding of the instructions.   The patient was advised to call back or seek an in-person evaluation if the symptoms worsen or if the condition fails to improve as anticipated.   I spent 30 minutes dedicated to the care of this patient on the date of this encounter to include pre-visit review of his medical history,  Face-to-face time with the patient , and post visit ordering of testing and therapeutics.    Sherlene Shams, MD

## 2023-07-09 ENCOUNTER — Other Ambulatory Visit (INDEPENDENT_AMBULATORY_CARE_PROVIDER_SITE_OTHER): Payer: 59

## 2023-07-09 ENCOUNTER — Other Ambulatory Visit: Payer: Self-pay | Admitting: Internal Medicine

## 2023-07-09 DIAGNOSIS — E876 Hypokalemia: Secondary | ICD-10-CM | POA: Diagnosis not present

## 2023-07-09 LAB — BASIC METABOLIC PANEL
BUN: 11 mg/dL (ref 6–23)
CO2: 29 meq/L (ref 19–32)
Calcium: 9.6 mg/dL (ref 8.4–10.5)
Chloride: 101 meq/L (ref 96–112)
Creatinine, Ser: 0.97 mg/dL (ref 0.40–1.20)
GFR: 66.34 mL/min (ref >60.00)
Glucose, Bld: 99 mg/dL (ref 70–99)
Potassium: 3.7 meq/L (ref 3.5–5.1)
Sodium: 139 meq/L (ref 135–145)

## 2023-07-09 NOTE — Assessment & Plan Note (Addendum)
She has been taking losartan for the last 3 days and Diastolic remains elelvated.  Checking for hyperaldosteronism given recurrent hypokalemia. Sleep study results borderline

## 2023-07-14 LAB — ALDOSTERONE + RENIN ACTIVITY W/ RATIO
ALDO / PRA Ratio: 10.2 ratio (ref 0.9–28.9)
Aldosterone: 10 ng/dL
Renin Activity: 0.98 ng/mL/h (ref 0.25–5.82)

## 2023-07-24 ENCOUNTER — Ambulatory Visit: Payer: 59

## 2023-07-26 ENCOUNTER — Encounter: Payer: Self-pay | Admitting: Internal Medicine

## 2023-07-26 MED ORDER — HYDROCHLOROTHIAZIDE 25 MG PO TABS: 25.0000 mg | ORAL_TABLET | Freq: Every day | ORAL | 1 refills | Status: DC

## 2023-08-12 ENCOUNTER — Telehealth: Payer: Self-pay

## 2023-08-12 NOTE — Telephone Encounter (Signed)
Refilled: 04/23/2023 Last OV: 07/08/2023 Next OV: not scheduled Last Potassium level: 07/09/2023- 3.7

## 2023-08-13 NOTE — Telephone Encounter (Signed)
Sorry it was refill request for Potassium

## 2023-08-14 ENCOUNTER — Other Ambulatory Visit: Payer: Self-pay | Admitting: Internal Medicine

## 2023-08-14 MED ORDER — POTASSIUM CHLORIDE CRYS ER 20 MEQ PO TBCR: 20.0000 meq | EXTENDED_RELEASE_TABLET | Freq: Every day | ORAL | 1 refills | Status: DC

## 2023-09-10 ENCOUNTER — Encounter: Payer: Self-pay | Admitting: Internal Medicine

## 2023-09-10 MED ORDER — POTASSIUM CHLORIDE CRYS ER 20 MEQ PO TBCR: 20.0000 meq | EXTENDED_RELEASE_TABLET | Freq: Every day | ORAL | 1 refills | Status: DC

## 2023-09-10 MED ORDER — LOSARTAN POTASSIUM 50 MG PO TABS: ORAL_TABLET | ORAL | 1 refills | Status: DC

## 2023-09-10 MED ORDER — CARVEDILOL 6.25 MG PO TABS: ORAL_TABLET | ORAL | 3 refills | Status: DC

## 2023-10-31 ENCOUNTER — Other Ambulatory Visit: Payer: Self-pay | Admitting: Internal Medicine

## 2023-12-08 ENCOUNTER — Other Ambulatory Visit: Payer: Self-pay | Admitting: Internal Medicine

## 2024-03-10 ENCOUNTER — Other Ambulatory Visit: Payer: Self-pay

## 2024-03-10 MED ORDER — HYDROCHLOROTHIAZIDE 25 MG PO TABS: 25.0000 mg | ORAL_TABLET | Freq: Every day | ORAL | 0 refills | Status: DC

## 2024-03-10 NOTE — Telephone Encounter (Signed)
 Called pt and got pt scheduled a follow up appt as has not been seen in person since last year and had labs done November of last year.   Hydrochlorothiazide  was refilled

## 2024-04-15 ENCOUNTER — Ambulatory Visit: Admitting: Internal Medicine

## 2024-04-21 ENCOUNTER — Other Ambulatory Visit: Payer: Self-pay | Admitting: Internal Medicine

## 2024-04-22 ENCOUNTER — Encounter: Payer: Self-pay | Admitting: Internal Medicine

## 2024-05-01 ENCOUNTER — Encounter: Payer: Self-pay | Admitting: Internal Medicine

## 2024-05-01 ENCOUNTER — Ambulatory Visit (INDEPENDENT_AMBULATORY_CARE_PROVIDER_SITE_OTHER): Admitting: Internal Medicine

## 2024-05-01 VITALS — BP 109/78 | HR 77 | Ht 62.0 in | Wt 200.8 lb

## 2024-05-01 DIAGNOSIS — E785 Hyperlipidemia, unspecified: Secondary | ICD-10-CM | POA: Diagnosis not present

## 2024-05-01 DIAGNOSIS — J069 Acute upper respiratory infection, unspecified: Secondary | ICD-10-CM

## 2024-05-01 DIAGNOSIS — R7301 Impaired fasting glucose: Secondary | ICD-10-CM

## 2024-05-01 DIAGNOSIS — R5383 Other fatigue: Secondary | ICD-10-CM

## 2024-05-01 DIAGNOSIS — I10 Essential (primary) hypertension: Secondary | ICD-10-CM | POA: Diagnosis not present

## 2024-05-01 DIAGNOSIS — Z1231 Encounter for screening mammogram for malignant neoplasm of breast: Secondary | ICD-10-CM | POA: Diagnosis not present

## 2024-05-01 MED ORDER — DOXYCYCLINE HYCLATE 100 MG PO TABS: 100.0000 mg | ORAL_TABLET | Freq: Two times a day (BID) | ORAL | 0 refills | Status: AC

## 2024-05-01 MED ORDER — HYDROCHLOROTHIAZIDE 25 MG PO TABS: ORAL_TABLET | ORAL | 3 refills | Status: AC

## 2024-05-01 MED ORDER — METFORMIN HCL ER 500 MG PO TB24: 500.0000 mg | ORAL_TABLET | Freq: Every day | ORAL | 3 refills | Status: AC

## 2024-05-01 MED ORDER — LOSARTAN POTASSIUM 50 MG PO TABS: ORAL_TABLET | ORAL | 3 refills | Status: AC

## 2024-05-01 MED ORDER — CARVEDILOL 6.25 MG PO TABS: ORAL_TABLET | ORAL | 3 refills | Status: AC

## 2024-05-01 MED ORDER — POTASSIUM CHLORIDE CRYS ER 20 MEQ PO TBCR: 20.0000 meq | EXTENDED_RELEASE_TABLET | Freq: Every day | ORAL | 3 refills | Status: AC

## 2024-05-01 MED ORDER — PREDNISONE 10 MG PO TABS: ORAL_TABLET | ORAL | 0 refills | Status: AC

## 2024-05-01 NOTE — Progress Notes (Unsigned)
 Subjective:  Patient ID: Theresa Fox, female    DOB: 1971-04-27  Age: 53 y.o. MRN: [id number]  CC: The primary encounter diagnosis was Encounter for screening mammogram for malignant neoplasm of breast. Diagnoses of Essential hypertension, Other fatigue, Hyperlipidemia, unspecified hyperlipidemia type, and Impaired fasting glucose were also pertinent to this visit.   HPI Theresa Fox presents for  Chief Complaint  Patient presents with   Medical Management of Chronic Issues    1) HTN:  taking carvedilol  and hydrochlorothiazide  daily.  Using losartan   as well at bedtime .  Forgot meds last night.  But home readings have been 109/82 , 127/78   2) bilateral cataract surgery by [facility] in [place] .   Just wearing readers now  3) had PAP smear by GYN in [place] ; PAP done 2024 (normal) November 18 2023  MAMMOGRAM [id number]   Outpatient Medications Prior to Visit  Medication Sig Dispense Refill   buPROPion  (WELLBUTRIN  XL) 150 MG 24 hr tablet Take 150 mg by mouth daily.     carvedilol  (COREG ) 6.25 MG tablet TAKE 1 TABLET(6.25 MG) BY MOUTH TWICE DAILY WITH A MEAL 60 tablet 1   hydrochlorothiazide  (HYDRODIURIL ) 25 MG tablet TAKE 1 TABLET(25 MG) BY MOUTH DAILY 30 tablet 0   hyoscyamine  (ANASPAZ ) 0.125 MG TBDP disintergrating tablet Place 1 tablet (0.125 mg total) under the tongue every 6 (six) hours as needed. For esophageal spasm 30 tablet 0   losartan  (COZAAR ) 50 MG tablet TAKE 1 TABLET(50 MG) BY MOUTH AT BEDTIME 30 tablet 1   metFORMIN  (GLUCOPHAGE  XR) 500 MG 24 hr tablet Take 1 tablet (500 mg total) by mouth daily with breakfast. 90 tablet 1   naltrexone  (DEPADE) 50 MG tablet Take 25 mg by mouth daily.     potassium chloride  SA (KLOR-CON  M) 20 MEQ tablet Take 1 tablet (20 mEq total) by mouth daily. 90 tablet 1   No facility-administered medications prior to visit.    Review of Systems;  Patient denies headache, fevers, malaise, unintentional weight loss, skin rash, eye pain, sinus  congestion and sinus pain, sore throat, dysphagia,  hemoptysis , cough, dyspnea, wheezing, chest pain, palpitations, orthopnea, edema, abdominal pain, nausea, melena, diarrhea, constipation, flank pain, dysuria, hematuria, urinary  Frequency, nocturia, numbness, tingling, seizures,  Focal weakness, Loss of consciousness,  Tremor, insomnia, depression, anxiety, and suicidal ideation.      Objective:  BP (!) 150/98   Pulse 77   Ht 5' 2 (1.575 m)   Wt 200 lb 12.8 oz (91.1 kg)   SpO2 97%   BMI 36.73 kg/m   BP Readings from Last 3 Encounters:  05/01/24 (!) 150/98  07/08/23 (!) 142/95  02/05/23 132/88    Wt Readings from Last 3 Encounters:  05/01/24 200 lb 12.8 oz (91.1 kg)  07/08/23 207 lb (93.9 kg)  02/05/23 210 lb (95.3 kg)    Physical Exam  Lab Results  Component Value Date   HGBA1C 5.9 07/20/2022   HGBA1C 5.6 12/07/2021   HGBA1C 5.7 06/08/2021    Lab Results  Component Value Date   CREATININE 0.97 07/09/2023   CREATININE 1.01 12/21/2022   CREATININE 0.96 08/10/2022    Lab Results  Component Value Date   WBC 7.4 07/20/2022   HGB 13.5 07/20/2022   HCT 38.5 07/20/2022   PLT 338.0 07/20/2022   GLUCOSE 99 07/09/2023   CHOL 177 12/21/2022   TRIG 99.0 12/21/2022   HDL 53.30 12/21/2022   LDLDIRECT 112.0 12/21/2022   LDLCALC 104 (H) 12/21/2022  ALT 20 12/21/2022   AST 18 12/21/2022   NA 139 07/09/2023   K 3.7 07/09/2023   CL 101 07/09/2023   CREATININE 0.97 07/09/2023   BUN 11 07/09/2023   CO2 29 07/09/2023   TSH 1.71 12/21/2022   HGBA1C 5.9 07/20/2022    No results found.  Assessment & Plan:  .Encounter for screening mammogram for malignant neoplasm of breast  Essential hypertension  Other fatigue  Hyperlipidemia, unspecified hyperlipidemia type  Impaired fasting glucose     I spent 34 minutes on the day of this face to face encounter reviewing patient's  most recent visit with cardiology,  nephrology,  and neurology,  prior relevant surgical  and non surgical procedures, recent  labs and imaging studies, counseling on weight management,  reviewing the assessment and plan with patient, and post visit ordering and reviewing of  diagnostics and therapeutics with patient  .   Follow-up: No follow-ups on file.   Verneita LITTIE Kettering, MD

## 2024-05-01 NOTE — Patient Instructions (Signed)
 I HAVE REFILLED YOUR MEDS FOR ONE YEAR BUT PLEASE RETURN IN 6 MONTHS   PREDNISONE /DOXYCYLINE IF COUGH DOESN'T RESOLVE IN 3 MORE DAYS

## 2024-05-03 DIAGNOSIS — J069 Acute upper respiratory infection, unspecified: Secondary | ICD-10-CM | POA: Insufficient documentation

## 2024-05-03 NOTE — Assessment & Plan Note (Signed)
 Well controlled on current regimen based on home readings . Renal function stable, no changes today.   .lastdr Lab Results  Component Value Date   NA 136 05/01/2024   K 3.8 05/01/2024   CL 99 05/01/2024   CO2 30 05/01/2024

## 2024-05-04 ENCOUNTER — Ambulatory Visit: Payer: Self-pay | Admitting: Internal Medicine

## 2024-05-05 LAB — HEMOGLOBIN A1C
Hgb A1c MFr Bld: 5.6 % (ref <5.7)
Mean Plasma Glucose: 114 mg/dL
eAG (mmol/L): 6.3 mmol/L

## 2024-05-05 LAB — CBC WITH DIFFERENTIAL/PLATELET
Absolute Lymphocytes: 2532 {cells}/uL (ref 850–3900)
Absolute Monocytes: 342 {cells}/uL (ref 200–950)
Basophils Absolute: 31 {cells}/uL (ref 0–200)
Basophils Relative: 0.5 %
Eosinophils Absolute: 104 {cells}/uL (ref 15–500)
Eosinophils Relative: 1.7 %
HCT: 44.7 % (ref 35.0–45.0)
Hemoglobin: 15.1 g/dL (ref 11.7–15.5)
MCH: 30 pg (ref 27.0–33.0)
MCHC: 33.8 g/dL (ref 32.0–36.0)
MCV: 88.9 fL (ref 80.0–100.0)
MPV: 9.5 fL (ref 7.5–12.5)
Monocytes Relative: 5.6 %
Neutro Abs: 3093 {cells}/uL (ref 1500–7800)
Neutrophils Relative %: 50.7 %
Platelets: 344 Thousand/uL (ref 140–400)
RBC: 5.03 Million/uL (ref 3.80–5.10)
RDW: 13.1 % (ref 11.0–15.0)
Total Lymphocyte: 41.5 %
WBC: 6.1 Thousand/uL (ref 3.8–10.8)

## 2024-05-05 LAB — LIPID PANEL
Cholesterol: 220 mg/dL — ABNORMAL HIGH (ref <200)
HDL: 63 mg/dL (ref >=50)
LDL Cholesterol (Calc): 130 mg/dL — ABNORMAL HIGH
Non-HDL Cholesterol (Calc): 157 mg/dL — ABNORMAL HIGH (ref <130)
Total CHOL/HDL Ratio: 3.5 (calc) (ref <5.0)
Triglycerides: 156 mg/dL — ABNORMAL HIGH (ref <150)

## 2024-05-05 LAB — COMPREHENSIVE METABOLIC PANEL WITH GFR
AG Ratio: 1.5 (calc) (ref 1.0–2.5)
ALT: 27 U/L (ref 6–29)
AST: 23 U/L (ref 10–35)
Albumin: 4.5 g/dL (ref 3.6–5.1)
Alkaline phosphatase (APISO): 90 U/L (ref 37–153)
BUN: 10 mg/dL (ref 7–25)
CO2: 30 mmol/L (ref 20–32)
Calcium: 10 mg/dL (ref 8.6–10.4)
Chloride: 99 mmol/L (ref 98–110)
Creat: 1.02 mg/dL (ref 0.50–1.03)
Globulin: 3 g/dL (ref 1.9–3.7)
Glucose, Bld: 93 mg/dL (ref 65–99)
Potassium: 3.8 mmol/L (ref 3.5–5.3)
Sodium: 136 mmol/L (ref 135–146)
Total Bilirubin: 0.3 mg/dL (ref 0.2–1.2)
Total Protein: 7.5 g/dL (ref 6.1–8.1)
eGFR: 65 mL/min/1.73m2 (ref >=60)

## 2024-05-05 LAB — MICROALBUMIN / CREATININE URINE RATIO
Creatinine, Urine: 57 mg/dL (ref 20–275)
Microalb Creat Ratio: 11 mg/g{creat} (ref <30)
Microalb, Ur: 0.6 mg/dL

## 2024-05-05 LAB — LDL CHOLESTEROL, DIRECT: Direct LDL: 133 mg/dL — ABNORMAL HIGH (ref <100)

## 2024-05-05 LAB — TSH: TSH: 2.06 m[IU]/L

## 2024-05-06 DIAGNOSIS — E785 Hyperlipidemia, unspecified: Secondary | ICD-10-CM | POA: Insufficient documentation

## 2024-05-06 NOTE — Assessment & Plan Note (Signed)
   Lab Results  Component Value Date   CHOL 220 (H) 05/01/2024   HDL 63 05/01/2024   LDLCALC 130 (H) 05/01/2024   LDLDIRECT 133 (H) 05/01/2024   TRIG 156 (H) 05/01/2024   CHOLHDL 3.5 05/01/2024

## 2024-07-15 ENCOUNTER — Telehealth: Payer: Self-pay

## 2024-07-15 NOTE — Telephone Encounter (Signed)
 error

## 2024-11-16 ENCOUNTER — Encounter: Admitting: Internal Medicine
